# Patient Record
Sex: Male | Born: 1970 | Race: White | Hispanic: No | Marital: Married | Smoking: Never smoker
Health system: Southern US, Community
[De-identification: ages and names within clinical notes are randomized; demographics above are authoritative.]

## PROBLEM LIST (undated history)

## (undated) DIAGNOSIS — F1910 Other psychoactive substance abuse, uncomplicated: Secondary | ICD-10-CM

## (undated) DIAGNOSIS — M1990 Unspecified osteoarthritis, unspecified site: Secondary | ICD-10-CM

## (undated) DIAGNOSIS — I10 Essential (primary) hypertension: Secondary | ICD-10-CM

## (undated) DIAGNOSIS — E119 Type 2 diabetes mellitus without complications: Secondary | ICD-10-CM

---

## 2006-08-28 ENCOUNTER — Ambulatory Visit: Payer: Self-pay | Admitting: Emergency Medicine

## 2006-08-28 LAB — CONVERTED CEMR LAB
Chloride: 103 meq/L (ref 96–112)
GFR calc Af Amer: 97 mL/min
GFR calc non Af Amer: 81 mL/min
Glucose, Bld: 97 mg/dL (ref 70–99)
Potassium: 3.9 meq/L (ref 3.5–5.1)
Pro B Natriuretic peptide (BNP): 9 pg/mL (ref 0.0–100.0)
Sodium: 140 meq/L (ref 135–145)

## 2006-09-02 ENCOUNTER — Ambulatory Visit: Payer: Self-pay

## 2006-09-16 ENCOUNTER — Ambulatory Visit (HOSPITAL_BASED_OUTPATIENT_CLINIC_OR_DEPARTMENT_OTHER): Admission: RE | Admit: 2006-09-16 | Discharge: 2006-09-16 | Payer: Self-pay | Admitting: Emergency Medicine

## 2006-09-28 ENCOUNTER — Ambulatory Visit: Payer: Self-pay | Admitting: Emergency Medicine

## 2006-10-02 ENCOUNTER — Ambulatory Visit: Payer: Self-pay | Admitting: Pulmonary Disease

## 2007-04-19 DIAGNOSIS — M1990 Unspecified osteoarthritis, unspecified site: Secondary | ICD-10-CM | POA: Insufficient documentation

## 2007-04-19 DIAGNOSIS — K219 Gastro-esophageal reflux disease without esophagitis: Secondary | ICD-10-CM

## 2007-04-19 DIAGNOSIS — E669 Obesity, unspecified: Secondary | ICD-10-CM

## 2007-04-19 DIAGNOSIS — I10 Essential (primary) hypertension: Secondary | ICD-10-CM | POA: Insufficient documentation

## 2007-04-19 DIAGNOSIS — R0602 Shortness of breath: Secondary | ICD-10-CM

## 2007-04-19 DIAGNOSIS — G609 Hereditary and idiopathic neuropathy, unspecified: Secondary | ICD-10-CM | POA: Insufficient documentation

## 2007-04-19 DIAGNOSIS — Z87891 Personal history of nicotine dependence: Secondary | ICD-10-CM

## 2013-01-14 ENCOUNTER — Institutional Professional Consult (permissible substitution): Payer: Self-pay | Admitting: Internal Medicine

## 2013-01-20 ENCOUNTER — Institutional Professional Consult (permissible substitution): Payer: Self-pay | Admitting: Internal Medicine

## 2013-01-24 ENCOUNTER — Institutional Professional Consult (permissible substitution): Payer: Self-pay | Admitting: Internal Medicine

## 2017-11-10 ENCOUNTER — Encounter (HOSPITAL_BASED_OUTPATIENT_CLINIC_OR_DEPARTMENT_OTHER): Payer: Self-pay

## 2019-08-10 ENCOUNTER — Encounter (HOSPITAL_COMMUNITY): Payer: Self-pay

## 2019-08-10 ENCOUNTER — Inpatient Hospital Stay (HOSPITAL_COMMUNITY)
Admission: EM | Admit: 2019-08-10 | Discharge: 2019-08-14 | DRG: 616 | Disposition: A | Discharge status: Home Health | Payer: Medicare Other | Attending: Internal Medicine | Admitting: Internal Medicine

## 2019-08-10 ENCOUNTER — Emergency Department (HOSPITAL_COMMUNITY): Payer: Medicare Other

## 2019-08-10 DIAGNOSIS — D509 Iron deficiency anemia, unspecified: Secondary | ICD-10-CM | POA: Diagnosis present

## 2019-08-10 DIAGNOSIS — E43 Unspecified severe protein-calorie malnutrition: Secondary | ICD-10-CM | POA: Diagnosis present

## 2019-08-10 DIAGNOSIS — E871 Hypo-osmolality and hyponatremia: Secondary | ICD-10-CM | POA: Diagnosis present

## 2019-08-10 DIAGNOSIS — L97509 Non-pressure chronic ulcer of other part of unspecified foot with unspecified severity: Secondary | ICD-10-CM | POA: Diagnosis not present

## 2019-08-10 DIAGNOSIS — Z885 Allergy status to narcotic agent status: Secondary | ICD-10-CM

## 2019-08-10 DIAGNOSIS — M1990 Unspecified osteoarthritis, unspecified site: Secondary | ICD-10-CM | POA: Diagnosis present

## 2019-08-10 DIAGNOSIS — E1152 Type 2 diabetes mellitus with diabetic peripheral angiopathy with gangrene: Secondary | ICD-10-CM | POA: Diagnosis present

## 2019-08-10 DIAGNOSIS — L97419 Non-pressure chronic ulcer of right heel and midfoot with unspecified severity: Secondary | ICD-10-CM | POA: Diagnosis present

## 2019-08-10 DIAGNOSIS — M869 Osteomyelitis, unspecified: Secondary | ICD-10-CM

## 2019-08-10 DIAGNOSIS — Z794 Long term (current) use of insulin: Secondary | ICD-10-CM | POA: Diagnosis not present

## 2019-08-10 DIAGNOSIS — E876 Hypokalemia: Secondary | ICD-10-CM | POA: Diagnosis present

## 2019-08-10 DIAGNOSIS — E11621 Type 2 diabetes mellitus with foot ulcer: Secondary | ICD-10-CM | POA: Diagnosis present

## 2019-08-10 DIAGNOSIS — E860 Dehydration: Secondary | ICD-10-CM | POA: Diagnosis present

## 2019-08-10 DIAGNOSIS — Z20822 Contact with and (suspected) exposure to covid-19: Secondary | ICD-10-CM | POA: Diagnosis present

## 2019-08-10 DIAGNOSIS — E1169 Type 2 diabetes mellitus with other specified complication: Principal | ICD-10-CM | POA: Diagnosis present

## 2019-08-10 DIAGNOSIS — Z6841 Body Mass Index (BMI) 40.0 and over, adult: Secondary | ICD-10-CM | POA: Diagnosis not present

## 2019-08-10 DIAGNOSIS — L089 Local infection of the skin and subcutaneous tissue, unspecified: Secondary | ICD-10-CM | POA: Diagnosis present

## 2019-08-10 DIAGNOSIS — I872 Venous insufficiency (chronic) (peripheral): Secondary | ICD-10-CM | POA: Diagnosis present

## 2019-08-10 DIAGNOSIS — K219 Gastro-esophageal reflux disease without esophagitis: Secondary | ICD-10-CM | POA: Diagnosis present

## 2019-08-10 DIAGNOSIS — G894 Chronic pain syndrome: Secondary | ICD-10-CM | POA: Diagnosis present

## 2019-08-10 DIAGNOSIS — L97929 Non-pressure chronic ulcer of unspecified part of left lower leg with unspecified severity: Secondary | ICD-10-CM | POA: Diagnosis not present

## 2019-08-10 DIAGNOSIS — I87333 Chronic venous hypertension (idiopathic) with ulcer and inflammation of bilateral lower extremity: Secondary | ICD-10-CM | POA: Diagnosis present

## 2019-08-10 DIAGNOSIS — F1123 Opioid dependence with withdrawal: Secondary | ICD-10-CM | POA: Diagnosis present

## 2019-08-10 DIAGNOSIS — I10 Essential (primary) hypertension: Secondary | ICD-10-CM | POA: Diagnosis present

## 2019-08-10 DIAGNOSIS — M86271 Subacute osteomyelitis, right ankle and foot: Secondary | ICD-10-CM | POA: Diagnosis present

## 2019-08-10 DIAGNOSIS — D72829 Elevated white blood cell count, unspecified: Secondary | ICD-10-CM | POA: Diagnosis present

## 2019-08-10 DIAGNOSIS — L97519 Non-pressure chronic ulcer of other part of right foot with unspecified severity: Secondary | ICD-10-CM | POA: Diagnosis present

## 2019-08-10 DIAGNOSIS — L02611 Cutaneous abscess of right foot: Secondary | ICD-10-CM | POA: Diagnosis present

## 2019-08-10 DIAGNOSIS — L97919 Non-pressure chronic ulcer of unspecified part of right lower leg with unspecified severity: Secondary | ICD-10-CM | POA: Diagnosis not present

## 2019-08-10 DIAGNOSIS — E1142 Type 2 diabetes mellitus with diabetic polyneuropathy: Secondary | ICD-10-CM | POA: Diagnosis present

## 2019-08-10 DIAGNOSIS — R7989 Other specified abnormal findings of blood chemistry: Secondary | ICD-10-CM | POA: Diagnosis present

## 2019-08-10 DIAGNOSIS — E1165 Type 2 diabetes mellitus with hyperglycemia: Secondary | ICD-10-CM | POA: Diagnosis present

## 2019-08-10 DIAGNOSIS — F1110 Opioid abuse, uncomplicated: Secondary | ICD-10-CM | POA: Diagnosis present

## 2019-08-10 HISTORY — DX: Essential (primary) hypertension: I10

## 2019-08-10 HISTORY — DX: Other psychoactive substance abuse, uncomplicated: F19.10

## 2019-08-10 HISTORY — DX: Type 2 diabetes mellitus without complications: E11.9

## 2019-08-10 HISTORY — DX: Unspecified osteoarthritis, unspecified site: M19.90

## 2019-08-10 LAB — COMPREHENSIVE METABOLIC PANEL
ALT: 12 U/L (ref 0–44)
AST: 13 U/L — ABNORMAL LOW (ref 15–41)
Albumin: 2 g/dL — ABNORMAL LOW (ref 3.5–5.0)
Alkaline Phosphatase: 194 U/L — ABNORMAL HIGH (ref 38–126)
Anion gap: 9 (ref 5–15)
BUN: 11 mg/dL (ref 6–20)
CO2: 29 mmol/L (ref 22–32)
Calcium: 8.5 mg/dL — ABNORMAL LOW (ref 8.9–10.3)
Chloride: 90 mmol/L — ABNORMAL LOW (ref 98–111)
Creatinine, Ser: 0.67 mg/dL (ref 0.61–1.24)
GFR calc Af Amer: >60 mL/min (ref >60)
GFR calc non Af Amer: >60 mL/min (ref >60)
Glucose, Bld: 279 mg/dL — ABNORMAL HIGH (ref 70–99)
Potassium: 4.1 mmol/L (ref 3.5–5.1)
Sodium: 128 mmol/L — ABNORMAL LOW (ref 135–145)
Total Bilirubin: 0.3 mg/dL (ref 0.3–1.2)
Total Protein: 8.4 g/dL — ABNORMAL HIGH (ref 6.5–8.1)

## 2019-08-10 LAB — CBC WITH DIFFERENTIAL/PLATELET
Abs Immature Granulocytes: 0.11 10*3/uL — ABNORMAL HIGH (ref 0.00–0.07)
Basophils Absolute: 0.1 10*3/uL (ref 0.0–0.1)
Basophils Relative: 0 %
Eosinophils Absolute: 0.2 10*3/uL (ref 0.0–0.5)
Eosinophils Relative: 1 %
HCT: 29 % — ABNORMAL LOW (ref 39.0–52.0)
Hemoglobin: 8.7 g/dL — ABNORMAL LOW (ref 13.0–17.0)
Immature Granulocytes: 1 %
Lymphocytes Relative: 14 %
Lymphs Abs: 2.4 10*3/uL (ref 0.7–4.0)
MCH: 22 pg — ABNORMAL LOW (ref 26.0–34.0)
MCHC: 30 g/dL (ref 30.0–36.0)
MCV: 73.2 fL — ABNORMAL LOW (ref 80.0–100.0)
Monocytes Absolute: 1.8 10*3/uL — ABNORMAL HIGH (ref 0.1–1.0)
Monocytes Relative: 11 %
Neutro Abs: 12.4 10*3/uL — ABNORMAL HIGH (ref 1.7–7.7)
Neutrophils Relative %: 73 %
Platelets: 611 10*3/uL — ABNORMAL HIGH (ref 150–400)
RBC: 3.96 MIL/uL — ABNORMAL LOW (ref 4.22–5.81)
RDW: 15.9 % — ABNORMAL HIGH (ref 11.5–15.5)
WBC: 16.9 10*3/uL — ABNORMAL HIGH (ref 4.0–10.5)
nRBC: 0 % (ref 0.0–0.2)

## 2019-08-10 LAB — LACTIC ACID, PLASMA: Lactic Acid, Venous: 1.6 mmol/L (ref 0.5–1.9)

## 2019-08-10 LAB — GLUCOSE, CAPILLARY: Glucose-Capillary: 216 mg/dL — ABNORMAL HIGH (ref 70–99)

## 2019-08-10 MED ORDER — ACETAMINOPHEN 325 MG PO TABS: 650.0000 mg | ORAL_TABLET | Freq: Four times a day (QID) | ORAL | Status: DC | PRN

## 2019-08-10 MED ORDER — SODIUM CHLORIDE 0.9 % IV SOLN
INTRAVENOUS | Status: DC
  Administered 2019-08-10: 19:00:00 1000 mL via INTRAVENOUS

## 2019-08-10 MED ORDER — INSULIN ASPART 100 UNIT/ML ~~LOC~~ SOLN
0.0000 [IU] | Freq: Every day | SUBCUTANEOUS | Status: DC
  Administered 2019-08-11: 2 [IU] via SUBCUTANEOUS
  Filled 2019-08-10: qty 0.05

## 2019-08-10 MED ORDER — SODIUM CHLORIDE 0.9 % IV SOLN
2.0000 g | Freq: Once | INTRAVENOUS | Status: AC
  Administered 2019-08-11: 01:00:00 2 g via INTRAVENOUS
  Filled 2019-08-10: qty 2

## 2019-08-10 MED ORDER — ONDANSETRON HCL 4 MG PO TABS: 4.0000 mg | ORAL_TABLET | Freq: Four times a day (QID) | ORAL | Status: DC | PRN

## 2019-08-10 MED ORDER — INSULIN ASPART 100 UNIT/ML ~~LOC~~ SOLN
0.0000 [IU] | Freq: Three times a day (TID) | SUBCUTANEOUS | Status: DC
  Administered 2019-08-11: 4 [IU] via SUBCUTANEOUS
  Administered 2019-08-13: 7 [IU] via SUBCUTANEOUS
  Administered 2019-08-14: 3 [IU] via SUBCUTANEOUS
  Filled 2019-08-10: qty 0.2

## 2019-08-10 MED ORDER — SODIUM CHLORIDE 0.9 % IV SOLN: INTRAVENOUS | Status: DC

## 2019-08-10 MED ORDER — KETOROLAC TROMETHAMINE 30 MG/ML IJ SOLN
30.0000 mg | Freq: Four times a day (QID) | INTRAMUSCULAR | Status: DC | PRN
  Administered 2019-08-11 – 2019-08-14 (×5): 30 mg via INTRAVENOUS
  Filled 2019-08-10 (×5): qty 1

## 2019-08-10 MED ORDER — VANCOMYCIN HCL IN DEXTROSE 1-5 GM/200ML-% IV SOLN
1000.0000 mg | Freq: Once | INTRAVENOUS | Status: AC
  Administered 2019-08-10: 1000 mg via INTRAVENOUS
  Filled 2019-08-10: qty 200

## 2019-08-10 MED ORDER — VANCOMYCIN HCL 1250 MG/250ML IV SOLN
1250.0000 mg | Freq: Once | INTRAVENOUS | Status: AC
  Administered 2019-08-11: 1250 mg via INTRAVENOUS
  Filled 2019-08-10 (×2): qty 250

## 2019-08-10 MED ORDER — ENOXAPARIN SODIUM 40 MG/0.4ML ~~LOC~~ SOLN
40.0000 mg | Freq: Every day | SUBCUTANEOUS | Status: DC
  Administered 2019-08-11: 40 mg via SUBCUTANEOUS
  Filled 2019-08-10: qty 0.4

## 2019-08-10 MED ORDER — ONDANSETRON HCL 4 MG/2ML IJ SOLN
4.0000 mg | Freq: Four times a day (QID) | INTRAMUSCULAR | Status: DC | PRN
  Administered 2019-08-11 (×2): 4 mg via INTRAVENOUS
  Filled 2019-08-10: qty 2

## 2019-08-10 MED ORDER — ACETAMINOPHEN 650 MG RE SUPP: 650.0000 mg | Freq: Four times a day (QID) | RECTAL | Status: DC | PRN

## 2019-08-10 NOTE — ED Provider Notes (Signed)
[facility]-EMERGENCY DEPT Provider Note   CSN: [id number] Arrival date & time: 08/10/19  1814     History Chief Complaint  Patient presents with  . Wound Infection    bottom right foot    Kurt Butler is a 49 y.o. male.  49 year old male presents with wounds to his right lower extremity.  States that he has had chronic pain for quite some time and has been self-medicating by snorting heroin.  Denies any fever or chills.  Patient notes that he has chronic paresthesias to his lower extremities.  Has noted some drainage from the heel of his right foot as well as from the right lateral aspect of his lower extremity.  No treatment used prior to arrival        Past Medical History:  Diagnosis Date  . Arthritis   . Diabetes mellitus without complication (HCC)   . Drug abuse (HCC)   . Hypertension     Patient Active Problem List   Diagnosis Date Noted  . OBESITY 04/19/2007  . PERIPHERAL NEUROPATHY 04/19/2007  . HYPERTENSION 04/19/2007  . GERD 04/19/2007  . OSTEOARTHRITIS 04/19/2007  . DYSPNEA 04/19/2007  . TOBACCO ABUSE, HX OF 04/19/2007         No family history on file.  Social History   Tobacco Use  . Smoking status: Not on file  Substance Use Topics  . Alcohol use: Not Currently  . Drug use: Yes    Comment: Sniffs heroin    Home Medications Prior to Admission medications   Not on File    Allergies    Oxycodone  Review of Systems   Review of Systems  All other systems reviewed and are negative.   Physical Exam Updated Vital Signs BP 139/66 (BP Location: Right Arm)   Pulse 99   Temp 99.8 F (37.7 C)   Resp (!) 21   Ht 1.854 m (6\' 1" )   Wt (!) 163.3 kg   SpO2 99%   BMI 47.50 kg/m   Physical Exam Vitals and nursing note reviewed.  Constitutional:      General: He is not in acute distress.    Appearance: Normal appearance. He is well-developed. He is not toxic-appearing.  HENT:     Head: Normocephalic and  atraumatic.  Eyes:     General: Lids are normal.     Conjunctiva/sclera: Conjunctivae normal.     Pupils: Pupils are equal, round, and reactive to light.  Neck:     Thyroid: No thyroid mass.     Trachea: No tracheal deviation.  Cardiovascular:     Rate and Rhythm: Normal rate and regular rhythm.     Heart sounds: Normal heart sounds. No murmur. No gallop.   Pulmonary:     Effort: Pulmonary effort is normal. No respiratory distress.     Breath sounds: Normal breath sounds. No stridor. No decreased breath sounds, wheezing, rhonchi or rales.  Abdominal:     General: Bowel sounds are normal. There is no distension.     Palpations: Abdomen is soft.     Tenderness: There is no abdominal tenderness. There is no rebound.  Musculoskeletal:        General: No tenderness. Normal range of motion.     Cervical back: Normal range of motion and neck supple.       Legs:  Skin:    General: Skin is warm and dry.     Coloration: Skin is ashen.     Findings: Lesion and  wound present.  Neurological:     Mental Status: He is alert and oriented to person, place, and time.     GCS: GCS eye subscore is 4. GCS verbal subscore is 5. GCS motor subscore is 6.     Cranial Nerves: No cranial nerve deficit.     Sensory: No sensory deficit.  Psychiatric:        Speech: Speech normal.        Behavior: Behavior normal.     ED Results / Procedures / Treatments   Labs (all labs ordered are listed, but only abnormal results are displayed) Labs Reviewed  CULTURE, BLOOD (ROUTINE X 2)  CULTURE, BLOOD (ROUTINE X 2)  CBC WITH DIFFERENTIAL/PLATELET  COMPREHENSIVE METABOLIC PANEL  LACTIC ACID, PLASMA    EKG None  Radiology No results found.  Procedures Procedures (including critical care time)  Medications Ordered in ED Medications  0.9 %  sodium chloride infusion (has no administration in time range)    ED Course  I have reviewed the triage vital signs and the nursing notes.  Pertinent labs &  imaging results that were available during my care of the patient were reviewed by me and considered in my medical decision making (see chart for details).    MDM Rules/Calculators/A&P                     Patient is x-rays consistent with osteomyelitis and patient started on vancomycin.  Does have leukocytosis noted.  Lactate within normal limits.  Will admit to the hospitalist service Final Clinical Impression(s) / ED Diagnoses Final diagnoses:  None    Rx / DC Orders ED Discharge Orders    None       Lacretia Leigh, MD 08/10/19 2015

## 2019-08-10 NOTE — ED Triage Notes (Signed)
Patient arrived by [facility] with c/o wound infection on bottom of right foot. EMS reports moderate amount of foul smelling drainage from wound. Patient endorses using heroin for pain control since he has not been going to pain clinic. Patient also concerned about withdrawing from heroin.

## 2019-08-11 ENCOUNTER — Other Ambulatory Visit: Payer: Self-pay | Admitting: Physician Assistant

## 2019-08-11 ENCOUNTER — Other Ambulatory Visit: Payer: Self-pay

## 2019-08-11 ENCOUNTER — Encounter (HOSPITAL_COMMUNITY): Payer: Self-pay | Admitting: Internal Medicine

## 2019-08-11 DIAGNOSIS — E11621 Type 2 diabetes mellitus with foot ulcer: Secondary | ICD-10-CM

## 2019-08-11 DIAGNOSIS — L97929 Non-pressure chronic ulcer of unspecified part of left lower leg with unspecified severity: Secondary | ICD-10-CM

## 2019-08-11 DIAGNOSIS — L089 Local infection of the skin and subcutaneous tissue, unspecified: Secondary | ICD-10-CM

## 2019-08-11 DIAGNOSIS — M86271 Subacute osteomyelitis, right ankle and foot: Secondary | ICD-10-CM

## 2019-08-11 DIAGNOSIS — Z6841 Body Mass Index (BMI) 40.0 and over, adult: Secondary | ICD-10-CM

## 2019-08-11 DIAGNOSIS — I87333 Chronic venous hypertension (idiopathic) with ulcer and inflammation of bilateral lower extremity: Secondary | ICD-10-CM

## 2019-08-11 DIAGNOSIS — Z794 Long term (current) use of insulin: Secondary | ICD-10-CM

## 2019-08-11 DIAGNOSIS — F1110 Opioid abuse, uncomplicated: Secondary | ICD-10-CM | POA: Diagnosis present

## 2019-08-11 DIAGNOSIS — E43 Unspecified severe protein-calorie malnutrition: Secondary | ICD-10-CM

## 2019-08-11 DIAGNOSIS — L97919 Non-pressure chronic ulcer of unspecified part of right lower leg with unspecified severity: Secondary | ICD-10-CM

## 2019-08-11 DIAGNOSIS — L97509 Non-pressure chronic ulcer of other part of unspecified foot with unspecified severity: Secondary | ICD-10-CM

## 2019-08-11 DIAGNOSIS — E6609 Other obesity due to excess calories: Secondary | ICD-10-CM

## 2019-08-11 LAB — COMPREHENSIVE METABOLIC PANEL
ALT: 12 U/L (ref 0–44)
AST: 24 U/L (ref 15–41)
Albumin: 1.9 g/dL — ABNORMAL LOW (ref 3.5–5.0)
Alkaline Phosphatase: 170 U/L — ABNORMAL HIGH (ref 38–126)
Anion gap: 10 (ref 5–15)
BUN: 10 mg/dL (ref 6–20)
CO2: 29 mmol/L (ref 22–32)
Calcium: 8.4 mg/dL — ABNORMAL LOW (ref 8.9–10.3)
Chloride: 95 mmol/L — ABNORMAL LOW (ref 98–111)
Creatinine, Ser: 0.7 mg/dL (ref 0.61–1.24)
GFR calc Af Amer: >60 mL/min (ref >60)
GFR calc non Af Amer: >60 mL/min (ref >60)
Glucose, Bld: 175 mg/dL — ABNORMAL HIGH (ref 70–99)
Potassium: 4.4 mmol/L (ref 3.5–5.1)
Sodium: 134 mmol/L — ABNORMAL LOW (ref 135–145)
Total Bilirubin: 1.1 mg/dL (ref 0.3–1.2)
Total Protein: 7.8 g/dL (ref 6.5–8.1)

## 2019-08-11 LAB — GLUCOSE, CAPILLARY
Glucose-Capillary: 178 mg/dL — ABNORMAL HIGH (ref 70–99)
Glucose-Capillary: 175 mg/dL — ABNORMAL HIGH (ref 70–99)
Glucose-Capillary: 178 mg/dL — ABNORMAL HIGH (ref 70–99)
Glucose-Capillary: 155 mg/dL — ABNORMAL HIGH (ref 70–99)

## 2019-08-11 LAB — CBC
HCT: 28.2 % — ABNORMAL LOW (ref 39.0–52.0)
Hemoglobin: 8.5 g/dL — ABNORMAL LOW (ref 13.0–17.0)
MCH: 21.9 pg — ABNORMAL LOW (ref 26.0–34.0)
MCHC: 30.1 g/dL (ref 30.0–36.0)
MCV: 72.5 fL — ABNORMAL LOW (ref 80.0–100.0)
Platelets: 582 10*3/uL — ABNORMAL HIGH (ref 150–400)
RBC: 3.89 MIL/uL — ABNORMAL LOW (ref 4.22–5.81)
RDW: 16 % — ABNORMAL HIGH (ref 11.5–15.5)
WBC: 16.2 10*3/uL — ABNORMAL HIGH (ref 4.0–10.5)
nRBC: 0 % (ref 0.0–0.2)

## 2019-08-11 LAB — HIV ANTIBODY (ROUTINE TESTING W REFLEX): HIV Screen 4th Generation wRfx: NONREACTIVE

## 2019-08-11 LAB — HEMOGLOBIN A1C
Hgb A1c MFr Bld: 9.8 % — ABNORMAL HIGH (ref 4.8–5.6)
Mean Plasma Glucose: 234.56 mg/dL

## 2019-08-11 LAB — SARS CORONAVIRUS 2 (TAT 6-24 HRS): SARS Coronavirus 2: NEGATIVE

## 2019-08-11 MED ORDER — LOPERAMIDE HCL 2 MG PO CAPS
2.0000 mg | ORAL_CAPSULE | ORAL | Status: DC | PRN
  Administered 2019-08-11: 11:00:00 2 mg via ORAL
  Filled 2019-08-11: qty 1

## 2019-08-11 MED ORDER — ONDANSETRON HCL 4 MG PO TABS
4.0000 mg | ORAL_TABLET | ORAL | Status: DC | PRN
  Administered 2019-08-14 (×2): 4 mg via ORAL
  Filled 2019-08-11 (×2): qty 1

## 2019-08-11 MED ORDER — PROMETHAZINE HCL 25 MG/ML IJ SOLN
25.0000 mg | Freq: Four times a day (QID) | INTRAMUSCULAR | Status: DC | PRN
  Administered 2019-08-11 – 2019-08-13 (×4): 25 mg via INTRAVENOUS
  Filled 2019-08-11 (×4): qty 1

## 2019-08-11 MED ORDER — METRONIDAZOLE IN NACL 5-0.79 MG/ML-% IV SOLN
500.0000 mg | Freq: Three times a day (TID) | INTRAVENOUS | Status: DC
  Administered 2019-08-11 – 2019-08-14 (×9): 500 mg via INTRAVENOUS
  Filled 2019-08-11 (×10): qty 100

## 2019-08-11 MED ORDER — PROMETHAZINE HCL 25 MG/ML IJ SOLN
12.5000 mg | Freq: Once | INTRAMUSCULAR | Status: AC
  Administered 2019-08-11: 12.5 mg via INTRAVENOUS
  Filled 2019-08-11: qty 1

## 2019-08-11 MED ORDER — ONDANSETRON HCL 4 MG/2ML IJ SOLN
4.0000 mg | INTRAMUSCULAR | Status: DC | PRN
  Administered 2019-08-11 – 2019-08-13 (×4): 4 mg via INTRAVENOUS
  Filled 2019-08-11 (×4): qty 2

## 2019-08-11 MED ORDER — ENOXAPARIN SODIUM 80 MG/0.8ML ~~LOC~~ SOLN: 0.5000 mg/kg | SUBCUTANEOUS | Status: DC

## 2019-08-11 MED ORDER — OXYCODONE HCL 5 MG PO TABS
5.0000 mg | ORAL_TABLET | ORAL | Status: AC | PRN
  Administered 2019-08-11 – 2019-08-13 (×4): 5 mg via ORAL
  Filled 2019-08-11 (×5): qty 1

## 2019-08-11 MED ORDER — IBUPROFEN 200 MG PO TABS: 600.0000 mg | ORAL_TABLET | Freq: Four times a day (QID) | ORAL | Status: DC | PRN

## 2019-08-11 MED ORDER — HYDROXYZINE HCL 25 MG PO TABS
25.0000 mg | ORAL_TABLET | Freq: Three times a day (TID) | ORAL | Status: DC | PRN
  Administered 2019-08-13 – 2019-08-14 (×2): 25 mg via ORAL
  Filled 2019-08-11 (×2): qty 1

## 2019-08-11 MED ORDER — BACLOFEN 5 MG HALF TABLET: 5.0000 mg | ORAL_TABLET | Freq: Three times a day (TID) | ORAL | Status: DC | PRN

## 2019-08-11 MED ORDER — MORPHINE SULFATE (PF) 4 MG/ML IV SOLN
4.0000 mg | INTRAVENOUS | Status: DC | PRN
  Administered 2019-08-11 – 2019-08-12 (×4): 4 mg via INTRAVENOUS
  Filled 2019-08-11 (×4): qty 1

## 2019-08-11 MED ORDER — PANTOPRAZOLE SODIUM 40 MG PO TBEC
40.0000 mg | DELAYED_RELEASE_TABLET | Freq: Every day | ORAL | Status: DC
  Administered 2019-08-11 – 2019-08-14 (×3): 40 mg via ORAL
  Filled 2019-08-11 (×3): qty 1

## 2019-08-11 MED ORDER — DICYCLOMINE HCL 20 MG PO TABS
20.0000 mg | ORAL_TABLET | Freq: Three times a day (TID) | ORAL | Status: DC
  Administered 2019-08-11 – 2019-08-14 (×11): 20 mg via ORAL
  Filled 2019-08-11 (×16): qty 1

## 2019-08-11 MED ORDER — SODIUM CHLORIDE 0.9 % IV SOLN
2.0000 g | INTRAVENOUS | Status: DC
  Administered 2019-08-11 – 2019-08-13 (×3): 2 g via INTRAVENOUS
  Filled 2019-08-11 (×4): qty 20

## 2019-08-11 MED ORDER — SODIUM CHLORIDE 0.9 % IV SOLN
2.0000 g | Freq: Three times a day (TID) | INTRAVENOUS | Status: DC
  Filled 2019-08-11 (×2): qty 2

## 2019-08-11 MED ORDER — CLONIDINE HCL 0.1 MG PO TABS
0.1000 mg | ORAL_TABLET | Freq: Once | ORAL | Status: AC
  Administered 2019-08-11: 0.1 mg via ORAL
  Filled 2019-08-11: qty 1

## 2019-08-11 MED ORDER — MORPHINE SULFATE (PF) 4 MG/ML IV SOLN
4.0000 mg | Freq: Once | INTRAVENOUS | Status: AC
  Administered 2019-08-11: 05:00:00 4 mg via INTRAVENOUS
  Filled 2019-08-11: qty 1

## 2019-08-11 MED ORDER — VANCOMYCIN HCL 1500 MG/300ML IV SOLN
1500.0000 mg | Freq: Three times a day (TID) | INTRAVENOUS | Status: DC
  Administered 2019-08-11 – 2019-08-13 (×6): 1500 mg via INTRAVENOUS
  Filled 2019-08-11 (×8): qty 300

## 2019-08-11 NOTE — Progress Notes (Signed)
Pharmacy Antibiotic Note  Kurt Butler is a 49 y.o. male admitted on 08/10/2019 with Osteomyelitis.  Pharmacy has been consulted for Vancomycin, cefepime dosing.  Plan: Vancomycin 1gm iv x1, then Vancomycin 1500 mg IV Q 8 hrs. Goal AUC 400-550. Expected AUC: 505 SCr used: 0.8 (adjusted) Vd: 0.5 L/kg  Cefepime 2gm iv x1, then 2gm iv q8hr   Height: 6\' 1"  (185.4 cm) Weight: (!) 360 lb (163.3 kg) IBW/kg (Calculated) : 79.9  Temp (24hrs), Avg:99.2 F (37.3 C), Min:98.6 F (37 C), Max:99.8 F (37.7 C)  Recent Labs  Lab 08/10/19 1920 08/11/19 0523  WBC 16.9* 16.2*  CREATININE 0.67 0.70  LATICACIDVEN 1.6  --     Estimated Creatinine Clearance: 179 mL/min (by C-G formula based on SCr of 0.7 mg/dL).    Allergies  Allergen Reactions  . Oxycodone Palpitations    Antimicrobials this admission: Vancomycin 08/11/2019 >> Cefepime 08/11/2019 >>   Dose adjustments this admission: -  Microbiology results: -  Thank you for allowing pharmacy to be a part of this patient's care.  08/13/2019 [place] 08/11/2019 6:13 AM

## 2019-08-11 NOTE — Consult Note (Signed)
ORTHOPAEDIC CONSULTATION  REQUESTING PHYSICIAN: Pokhrel, Laxman, MD  Chief Complaint: Nausea and vomiting with necrotic ulcer right foot and heroin withdrawal  HPI: Kurt Butler is a 49 y.o. male who presents with osteomyelitis abscess and ulceration of the right hindfoot.  Patient states he has been in extreme pain his doctor has cut off his pain medicine and patient has been snorting heroin.  Past medical history significant for type 2 diabetes arthritis drug abuse and hypertension.  Social history patient states he lives at home with spouse and child.    Social History   Socioeconomic History  . Marital status: Married    Spouse name: Not on file  . Number of children: Not on file  . Years of education: Not on file  . Highest education level: Not on file  Occupational History  . Not on file  Tobacco Use  . Smoking status: Never Smoker  . Smokeless tobacco: Never Used  Substance and Sexual Activity  . Alcohol use: Not Currently  . Drug use: Yes    Comment: Sniffs heroin  . Sexual activity: Not Currently  Other Topics Concern  . Not on file  Social History Narrative  . Not on file   Social Determinants of Health   Financial Resource Strain:   . Difficulty of Paying Living Expenses: Not on file  Food Insecurity:   . Worried About Charity fundraiser in the Last Year: Not on file  . Ran Out of Food in the Last Year: Not on file  Transportation Needs:   . Lack of Transportation (Medical): Not on file  . Lack of Transportation (Non-Medical): Not on file  Physical Activity:   . Days of Exercise per Week: Not on file  . Minutes of Exercise per Session: Not on file  Stress:   . Feeling of Stress : Not on file  Social Connections:   . Frequency of Communication with Friends and Family: Not on file  . Frequency of Social Gatherings with Friends and Family: Not on file  . Attends Religious Services: Not on file  . Active Member of Clubs or Organizations: Not on  file  . Attends Archivist Meetings: Not on file  . Marital Status: Not on file   No family history on file. - negative except otherwise stated in the family history section Allergies  Allergen Reactions  . Oxycodone Palpitations   Prior to Admission medications   Not on File   DG Ankle 2 Views Right  Result Date: 08/10/2019 CLINICAL DATA:  Wound infection on bottom of foot EXAM: RIGHT FOOT - 2 VIEW; RIGHT ANKLE - 2 VIEW COMPARISON:  Report 01/18/2013 FINDINGS: Right foot: Diffuse soft tissue edema. No fracture or malalignment within the digits of the right foot. Loss of the heel pad soft tissues presumably due to large ulcer. Lucency and probable mild fragmentation at the plantar surface of the calcaneus. Distorted appearance of the talocalcaneal interval. Potential widened joint space between the navicular and first cuneiform dorsally. Right ankle: Mild periosteal reaction involving the tibial and fibular shaft distally. Diffuse soft tissue edema. Ankle mortise grossly symmetric. Abnormal appearance of calcaneus. Distorted anatomy at the talocalcaneal interval. IMPRESSION: 1. Prominent loss of heel pad soft tissues with lucency, presumably due to large ulceration. There is lucency and cortical fragmentation of the plantar aspect of the calcaneus bone suspicious for osteomyelitis. 2. Poorly defined subtalar joints with distorted appearance of hindfoot anatomy. MRI would better demonstrate extent of suspected osteomyelitic changes.  Electronically Signed   By: Jasmine Pang M.D.   On: 08/10/2019 19:29   DG Foot 2 Views Right  Result Date: 08/10/2019 CLINICAL DATA:  Wound infection on bottom of foot EXAM: RIGHT FOOT - 2 VIEW; RIGHT ANKLE - 2 VIEW COMPARISON:  Report 01/18/2013 FINDINGS: Right foot: Diffuse soft tissue edema. No fracture or malalignment within the digits of the right foot. Loss of the heel pad soft tissues presumably due to large ulcer. Lucency and probable mild  fragmentation at the plantar surface of the calcaneus. Distorted appearance of the talocalcaneal interval. Potential widened joint space between the navicular and first cuneiform dorsally. Right ankle: Mild periosteal reaction involving the tibial and fibular shaft distally. Diffuse soft tissue edema. Ankle mortise grossly symmetric. Abnormal appearance of calcaneus. Distorted anatomy at the talocalcaneal interval. IMPRESSION: 1. Prominent loss of heel pad soft tissues with lucency, presumably due to large ulceration. There is lucency and cortical fragmentation of the plantar aspect of the calcaneus bone suspicious for osteomyelitis. 2. Poorly defined subtalar joints with distorted appearance of hindfoot anatomy. MRI would better demonstrate extent of suspected osteomyelitic changes. Electronically Signed   By: Jasmine Pang M.D.   On: 08/10/2019 19:29   - pertinent xrays, CT, MRI studies were reviewed and independently interpreted  Positive ROS: All other systems have been reviewed and were otherwise negative with the exception of those mentioned in the HPI and as above.  Physical Exam: General: Alert, no acute distress Psychiatric: Patient is competent for consent with normal mood and affect Lymphatic: No axillary or cervical lymphadenopathy Cardiovascular: No pedal edema Respiratory: No cyanosis, no use of accessory musculature GI: No organomegaly, abdomen is soft and non-tender    Images:  @ENCIMAGES @  Labs:  Lab Results  Component Value Date   HGBA1C 9.8 (H) 08/11/2019    Lab Results  Component Value Date   ALBUMIN 1.9 (L) 08/11/2019   ALBUMIN 2.0 (L) 08/10/2019    Neurologic: Patient does not have protective sensation bilateral lower extremities.   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  Examination the right lower extremity patient has large necrotic ulcerations on the leg distal and the mid third of the leg.  There is a large necrotic purulent drainage ulcer of the right heel with exposed necrotic heel bone.  Review of the radiographs shows chronic destructive changes of the calcaneus on the right.  Patient does not have palpable pulses bilaterally this may be due to his swelling.  Patient is nauseated and vomiting.  Patient has uncontrolled type 2 diabetes with a hemoglobin A1c of 9.8 and severe protein caloric malnutrition with an albumin of 1.9.  Assessment: Assessment: Uncontrolled type 2 diabetes with severe protein caloric malnutrition with gangrene abscess and osteomyelitis of the right hindfoot and right leg with ulceration on the left hindfoot and left leg as well.  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  History of snorting heroin.  Plan: Plan: Discussed with the patient that we will need to proceed with a right transtibial amputation tomorrow.  Ideally we can unload the left foot and get this to heal.  Discussed risks of surgery including nonhealing of the wound due to the massive lymphatic and  venous insufficiency risk of aspiration with surgery risk of death need for additional surgery.  Patient states he understands and wishes to proceed at this time.  Thank you for the consult and the opportunity to see Mr. Gerarda Fraction, MD [facility] [phone] 11:17 AM

## 2019-08-11 NOTE — H&P (Signed)
History and Physical    Kurt Butler DIY:[id number] DOB: [birth year] DOA: 08/10/2019  PCP: Patient, No Pcp Per (Confirm with patient/family/NH records and if not entered, this has to be entered at [facility] point of entry) Patient coming from: Home  I have personally briefly reviewed patient's old medical records in [facility]  Chief Complaint: Wound of right lower extremity  HPI: Kurt Butler is a 49 y.o. male with medical history significant of diabetes mellitus hypertension, drug abuse and arthritis presented to ED for evaluation of wound in his right lower extremity.  Patient states that he is having chronic pain and infection that is going on for some time and he is self medicating him with heroin.  Patient states that the drainage from heel of his right foot is getting worse and it is very painful and also the wound on right lateral aspect of lower extremity is also getting worse.  Patient also admits of having occasional fever with chills and nausea with vomiting but denies headache, dizziness, chest pain, shortness of breath, abdominal pain and urinary symptoms.  ED Course: Noted to ED patient had temperature of 99.8, blood pressure 136/66, heart rate between 99-103, respiratory rate 21 and oxygen saturation 99% on room air.  CBC showed leukocytosis with WBC count of 16.9 and blood glucose of 279.  X-ray of right lower extremity was consistent with osteomyelitis and patient was started on vancomycin and promethazine.  Review of Systems: As per HPI otherwise 10 point review of systems negative.    Past Medical History:  Diagnosis Date  . Arthritis   . Diabetes mellitus without complication (HCC)   . Drug abuse (HCC)   . Hypertension      The histories are not reviewed yet. Please review them in the "History" navigator section and refresh this SmartLink.   reports that he has never smoked. He has never used smokeless tobacco. He reports previous alcohol use. He reports current  drug use.  Allergies  Allergen Reactions  . Oxycodone Palpitations    No family history on file. Family history is reviewed with the patient but not pertinent.  Prior to Admission medications   Not on File    Physical Exam: Vitals:   08/10/19 2230 08/10/19 2300 08/10/19 2353 08/11/19 0612  BP: (!) 149/75 (!) 141/69 (!) 147/81 (!) 169/95  Pulse: (!) 107 89 99 96  Resp: 19  15 18   Temp:   98.6 F (37 C) 98.2 F (36.8 C)  TempSrc:   Oral Axillary  SpO2: 96% 94% 99% 100%  Weight:      Height:        Constitutional: NAD, calm, comfortable Vitals:   08/10/19 2230 08/10/19 2300 08/10/19 2353 08/11/19 0612  BP: (!) 149/75 (!) 141/69 (!) 147/81 (!) 169/95  Pulse: (!) 107 89 99 96  Resp: 19  15 18   Temp:   98.6 F (37 C) 98.2 F (36.8 C)  TempSrc:   Oral Axillary  SpO2: 96% 94% 99% 100%  Weight:      Height:       Eyes: PERRL, lids and conjunctivae normal ENMT: Mucous membranes are moist. Posterior pharynx clear of any exudate or lesions.Normal dentition.  Neck: normal, supple, no masses, no thyromegaly Respiratory: clear to auscultation bilaterally, no wheezing, no crackles. Normal respiratory effort. No accessory muscle use.  Cardiovascular: Regular rate and rhythm, no murmurs / rubs / gallops. No extremity edema. 2+ pedal pulses. No carotid bruits.  Abdomen: no tenderness, no masses palpated.  No hepatosplenomegaly. Bowel sounds positive.  Musculoskeletal: no clubbing / cyanosis. No joint deformity upper and lower extremities. Good ROM, no contractures. Normal muscle tone.  Skin: Large necrotic area noted with foul-smelling discharge on lateral right lower extremity in the shin area while large necrotic area at heel with surrounding erythema.  Left heel has healed wound. Neurologic: CN 2-12 grossly intact. Sensation intact, DTR normal. Strength 5/5 in all 4.  Psychiatric: Normal judgment and insight. Alert and oriented x 3. Normal mood.     Labs on Admission: I have  personally reviewed following labs and imaging studies  CBC: Recent Labs  Lab 08/10/19 1920 08/11/19 0523  WBC 16.9* 16.2*  NEUTROABS 12.4*  --   HGB 8.7* 8.5*  HCT 29.0* 28.2*  MCV 73.2* 72.5*  PLT 611* 433*   Basic Metabolic Panel: Recent Labs  Lab 08/10/19 1920 08/11/19 0523  NA 128* 134*  K 4.1 4.4  CL 90* 95*  CO2 29 29  GLUCOSE 279* 175*  BUN 11 10  CREATININE 0.67 0.70  CALCIUM 8.5* 8.4*   GFR: Estimated Creatinine Clearance: 179 mL/min (by C-G formula based on SCr of 0.7 mg/dL). Liver Function Tests: Recent Labs  Lab 08/10/19 1920 08/11/19 0523  AST 13* 24  ALT 12 12  ALKPHOS 194* 170*  BILITOT 0.3 1.1  PROT 8.4* 7.8  ALBUMIN 2.0* 1.9*   No results for input(s): LIPASE, AMYLASE in the last 168 hours. No results for input(s): AMMONIA in the last 168 hours. Coagulation Profile: No results for input(s): INR, PROTIME in the last 168 hours. Cardiac Enzymes: No results for input(s): CKTOTAL, CKMB, CKMBINDEX, TROPONINI in the last 168 hours. BNP (last 3 results) No results for input(s): PROBNP in the last 8760 hours. HbA1C: No results for input(s): HGBA1C in the last 72 hours. CBG: Recent Labs  Lab 08/10/19 2358  GLUCAP 216*   Lipid Profile: No results for input(s): CHOL, HDL, LDLCALC, TRIG, CHOLHDL, LDLDIRECT in the last 72 hours. Thyroid Function Tests: No results for input(s): TSH, T4TOTAL, FREET4, T3FREE, THYROIDAB in the last 72 hours. Anemia Panel: No results for input(s): VITAMINB12, FOLATE, FERRITIN, TIBC, IRON, RETICCTPCT in the last 72 hours. Urine analysis: No results found for: COLORURINE, APPEARANCEUR, [place], [place], [place], [place], BILIRUBINUR, KETONESUR, PROTEINUR, UROBILINOGEN, NITRITE, LEUKOCYTESUR  Radiological Exams on Admission: DG Ankle 2 Views Right  Result Date: 08/10/2019 CLINICAL DATA:  Wound infection on bottom of foot EXAM: RIGHT FOOT - 2 VIEW; RIGHT ANKLE - 2 VIEW COMPARISON:  Report 01/18/2013 FINDINGS: Right  foot: Diffuse soft tissue edema. No fracture or malalignment within the digits of the right foot. Loss of the heel pad soft tissues presumably due to large ulcer. Lucency and probable mild fragmentation at the plantar surface of the calcaneus. Distorted appearance of the talocalcaneal interval. Potential widened joint space between the navicular and first cuneiform dorsally. Right ankle: Mild periosteal reaction involving the tibial and fibular shaft distally. Diffuse soft tissue edema. Ankle mortise grossly symmetric. Abnormal appearance of calcaneus. Distorted anatomy at the talocalcaneal interval. IMPRESSION: 1. Prominent loss of heel pad soft tissues with lucency, presumably due to large ulceration. There is lucency and cortical fragmentation of the plantar aspect of the calcaneus bone suspicious for osteomyelitis. 2. Poorly defined subtalar joints with distorted appearance of hindfoot anatomy. MRI would better demonstrate extent of suspected osteomyelitic changes. Electronically Signed   By: Donavan Foil M.D.   On: 08/10/2019 19:29   DG Foot 2 Views Right  Result Date: 08/10/2019 CLINICAL DATA:  Wound infection on bottom of foot EXAM: RIGHT FOOT - 2 VIEW; RIGHT ANKLE - 2 VIEW COMPARISON:  Report 01/18/2013 FINDINGS: Right foot: Diffuse soft tissue edema. No fracture or malalignment within the digits of the right foot. Loss of the heel pad soft tissues presumably due to large ulcer. Lucency and probable mild fragmentation at the plantar surface of the calcaneus. Distorted appearance of the talocalcaneal interval. Potential widened joint space between the navicular and first cuneiform dorsally. Right ankle: Mild periosteal reaction involving the tibial and fibular shaft distally. Diffuse soft tissue edema. Ankle mortise grossly symmetric. Abnormal appearance of calcaneus. Distorted anatomy at the talocalcaneal interval. IMPRESSION: 1. Prominent loss of heel pad soft tissues with lucency, presumably due to  large ulceration. There is lucency and cortical fragmentation of the plantar aspect of the calcaneus bone suspicious for osteomyelitis. 2. Poorly defined subtalar joints with distorted appearance of hindfoot anatomy. MRI would better demonstrate extent of suspected osteomyelitic changes. Electronically Signed   By: Jasmine Pang M.D.   On: 08/10/2019 19:29      Assessment/Plan Principal Problem:   Type 2 diabetes mellitus with foot ulcer (HCC) Patient had bad wound at right lower extremity and x-ray is showing possible osteomyelitis. General surgery consulted for evaluation and possible debridement Continue IV vancomycin and IV cefepime Continue ketorolac for pain. Sliding scale insulin ordered for diabetes. Patient is advised about medication compliance.  Active Problems:   OBESITY Patient needs to be on low-carb healthy diet.  Patient states that he mostly eat junk food.      Heroin abuse [facility]) Patient counseled about stop using heroin.    DVT prophylaxis: Lovenox Code Status: Full code  Consults called: General surgery  Admission status: Inpatient-telemetry   Thalia Party MD [facility] Pager [phone]  If 7PM-7AM, please contact night-coverage [email or web address] Password   08/11/2019, 6:50 AM

## 2019-08-11 NOTE — Progress Notes (Addendum)
Same day note  Patient seen and examined at bedside.  Patient was admitted to the hospital for wounds of the lower extremity  At the time of my evaluation, patient complains of lower extremity pain nausea  Physical examination reveals morbidly obese male with bilateral lower extremity necrotic ulceration edema.  Upper extremity ulcerations.  Laboratory data and imaging was reviewed  Assessment and Plan.  Diabetes mellitus with necrotic ulcerations of the right lower extremity.  X-ray of the right lower extremity showing possible osteomyelitis.  General surgery consulted.  Continue IV vancomycin and cefepime.  Continue sliding scale insulin.  Accu-Cheks.  Diabetic diet.  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  Patient feels  sick.  Requesting narcotics.  Patient uses oxycodone at home and states that he is not allergic to it.  Will start on low-dose oxycodone today.  Closely watch for withdrawal.  Put the patient on symptomatic treatment for withdrawal.  No Charge  Signed,  Tenny Craw, MD [facility]  Addendum:  08/11/2019 10:42 AM   Due to necrotic ulceration osteomyelitis and involvement of the foot orthopedics was consulted.  Spoke with Dr. Everardo Pacific who recommended transfer to [facility].  Dr. Lajoyce Corners will follow the patient when the patient is at [facility].  We will put the patient for transfer.

## 2019-08-11 NOTE — Progress Notes (Signed)
Patient ID: Kurt Butler, male   DOB: Apr 06, 1971, 49 y.o.   MRN: [id number] Anticipate transfer to [facility] today.  I will reserve space on the [facility]  operating room schedule for possible surgery tomorrow for the right lower extremity.  I will evaluate patient at [facility] once transferred.

## 2019-08-11 NOTE — Progress Notes (Signed)
Spoke to Dr. Welton Flakes patient given both Toradol and morphine during the shift spoke with the patient about oxycodone allergy and that Dr. Stann Mainland not order that medication because of that. I advises the patient to stay until the MD rounds this morning patient says he does not know he just can't feel this sick.

## 2019-08-11 NOTE — Progress Notes (Signed)
Dr. 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 Dr. Welton Flakes wanted patient to receive a one time dose of clonidine and morphine. Patient had a allergy to oxycodone and Dr. paged back to ask if he wanted patient on  A different medications. Patient ask prior if he had a reaction to morphine or any palpitations patient denied. MD made aware and was told that. MD always aware that pharmacy was spoken to who than told me to clarify with the doctor. MD put in the morphine order and says to go ahead and give.

## 2019-08-11 NOTE — Progress Notes (Signed)
Paged Dr. Craige Cotta to ask for clonidine for withdrawals and said could not order anything  Says the g admitting doctor has not finished putting in orders and there is no assessment so can't order anything at this time.

## 2019-08-11 NOTE — Progress Notes (Signed)
Inpatient Diabetes Program Recommendations  AACE/ADA: New Consensus Statement on Inpatient Glycemic Control (2015)  Target Ranges:  Prepandial:   less than 140 mg/dL      Peak postprandial:   less than 180 mg/dL (1-2 hours)      Critically ill patients:  140 - 180 mg/dL   Lab Results  Component Value Date   GLUCAP 178 (H) 08/11/2019   HGBA1C 9.8 (H) 08/11/2019    Review of Glycemic Control  Diabetes history: DM2 Outpatient Diabetes medications: none, previously on metformin 2000 mg QAM, Amaryl 4 mg QD Current orders for Inpatient glycemic control: Novolog 0-20 units tidwc and 0-5 units QHS  HgbA1C - 9.8%. Was 7.3% on 10/21/17. Will likely need to go home on insulin, d/t needing tight glycemic control for healing.  Inpatient Diabetes Program Recommendations:     Will speak with pt this afternoon about his HgbA1C of 9.8% and importance of getting blood sugars in good control. Will need to f/u with PCP for diabetes management.   Thank you. Ailene Ards, RD, LDN, CDE Inpatient Diabetes Coordinator [phone]

## 2019-08-11 NOTE — Consult Note (Signed)
WOC Nurse Consult Note: Reason for Consult: Bilateral foot plantar aspect ulcerations, full thickness, R>L. Charcot foot deformities.  Right plantar foot with calcaneous palpable. Right anterior LE with full thickness ulcer and 5th digit with blood-filled blister.  Left LE with lateral partial thickness skin loss. I am assisted in my assessment today by [facility] and patient's Bedside RN, Dahlia Client.  Her expertise and flexibility is appreciated. The Bedside RN additionally medicated the patient for pain during my assessment and wound dressing changes. Wound type: Infectious, neuropathic Pressure Injury POA: N/A  Measurements: Left plantar foot at heel:  6cm x 5cm x 0.2cm pale pink, non granulating full thickness wound with closed wound edges. Moderate amount of serous exudate. Left lateral LE: 15cm x 12cm x 0.1cm  Right foot, 5th lateral digit:  Intact blood-filled blister measuring 3cm x 2.5cm Right plantar foot:  20cm x 11cm ulceration with depth unknown due to the presence of necrotic tissue. Distal third of wound is the most severe area of tissue destruction and reveals the calcaneous.  Drainage is serosanguinous, in a large amount and with a strong odor consistent with nonviable tissue. This wound requires evaluation by a physician. Right anterior foot: 7cm x 4cm x 0.2cm full thickness wound with dried serum, pale pink wound bed, nongranulating wound bed and closed wound edges. Moderate amount serosanguinous drainage. Bilateral hands with numerous areas of skin loss (dry full thickness wounds, R>L): Dry, crusted wounds with poor hygiene evident. Edematous. No evidence of infection.  Wound bed: As described above Drainage (amount, consistency, odor) As described above Periwound: As described above Dressing procedure/placement/frequency: Today I will provide conservative wound care guidance for topical care for these wounds via the Orders. Additionally, I have provided a Prevalon boot for pressure  redistribution for the left LE and this will also correct lateral rotation onto the area of tissue loss. A silicone foam dressing is provided to the sacrum to prevent tissue injury.  Patient is not likely to be mobilizing in bed due to the lower extremity wounds and his reported level of discomfort.  Recommend Orthopedic consultation for consideration of limb salvageability vs maintenance wound care for the right lower extremity/foot as well as input on the LLE and bilateral hands.  WOC nursing team will not follow, but will remain available to this patient, the nursing and medical teams.  Please re-consult if needed. Thanks, Ladona Mow, MSN, RN, GNP, [place]  Pager# [phone]

## 2019-08-11 NOTE — Progress Notes (Signed)
MD put in order for patient to transport to [facility].  Bed request placed.  Patient assigned to 5N18.  Report called to Merrill, Charity fundraiser.  Patient transported via [facility].  Levora Angel, RN

## 2019-08-11 NOTE — H&P (View-Only) (Signed)
ORTHOPAEDIC CONSULTATION  REQUESTING PHYSICIAN: Pokhrel, Laxman, MD  Chief Complaint: Nausea and vomiting with necrotic ulcer right foot and heroin withdrawal  HPI: Kurt Butler is a 49 y.o. male who presents with osteomyelitis abscess and ulceration of the right hindfoot.  Patient states he has been in extreme pain his doctor has cut off his pain medicine and patient has been snorting heroin.  Past medical history significant for type 2 diabetes arthritis drug abuse and hypertension.  Social history patient states he lives at home with spouse and child.    Social History   Socioeconomic History  . Marital status: Married    Spouse name: Not on file  . Number of children: Not on file  . Years of education: Not on file  . Highest education level: Not on file  Occupational History  . Not on file  Tobacco Use  . Smoking status: Never Smoker  . Smokeless tobacco: Never Used  Substance and Sexual Activity  . Alcohol use: Not Currently  . Drug use: Yes    Comment: Sniffs heroin  . Sexual activity: Not Currently  Other Topics Concern  . Not on file  Social History Narrative  . Not on file   Social Determinants of Health   Financial Resource Strain:   . Difficulty of Paying Living Expenses: Not on file  Food Insecurity:   . Worried About Charity fundraiser in the Last Year: Not on file  . Ran Out of Food in the Last Year: Not on file  Transportation Needs:   . Lack of Transportation (Medical): Not on file  . Lack of Transportation (Non-Medical): Not on file  Physical Activity:   . Days of Exercise per Week: Not on file  . Minutes of Exercise per Session: Not on file  Stress:   . Feeling of Stress : Not on file  Social Connections:   . Frequency of Communication with Friends and Family: Not on file  . Frequency of Social Gatherings with Friends and Family: Not on file  . Attends Religious Services: Not on file  . Active Member of Clubs or Organizations: Not on  file  . Attends Archivist Meetings: Not on file  . Marital Status: Not on file   No family history on file. - negative except otherwise stated in the family history section Allergies  Allergen Reactions  . Oxycodone Palpitations   Prior to Admission medications   Not on File   DG Ankle 2 Views Right  Result Date: 08/10/2019 CLINICAL DATA:  Wound infection on bottom of foot EXAM: RIGHT FOOT - 2 VIEW; RIGHT ANKLE - 2 VIEW COMPARISON:  Report 01/18/2013 FINDINGS: Right foot: Diffuse soft tissue edema. No fracture or malalignment within the digits of the right foot. Loss of the heel pad soft tissues presumably due to large ulcer. Lucency and probable mild fragmentation at the plantar surface of the calcaneus. Distorted appearance of the talocalcaneal interval. Potential widened joint space between the navicular and first cuneiform dorsally. Right ankle: Mild periosteal reaction involving the tibial and fibular shaft distally. Diffuse soft tissue edema. Ankle mortise grossly symmetric. Abnormal appearance of calcaneus. Distorted anatomy at the talocalcaneal interval. IMPRESSION: 1. Prominent loss of heel pad soft tissues with lucency, presumably due to large ulceration. There is lucency and cortical fragmentation of the plantar aspect of the calcaneus bone suspicious for osteomyelitis. 2. Poorly defined subtalar joints with distorted appearance of hindfoot anatomy. MRI would better demonstrate extent of suspected osteomyelitic changes.  Electronically Signed   By: Jasmine Pang M.D.   On: 08/10/2019 19:29   DG Foot 2 Views Right  Result Date: 08/10/2019 CLINICAL DATA:  Wound infection on bottom of foot EXAM: RIGHT FOOT - 2 VIEW; RIGHT ANKLE - 2 VIEW COMPARISON:  Report 01/18/2013 FINDINGS: Right foot: Diffuse soft tissue edema. No fracture or malalignment within the digits of the right foot. Loss of the heel pad soft tissues presumably due to large ulcer. Lucency and probable mild  fragmentation at the plantar surface of the calcaneus. Distorted appearance of the talocalcaneal interval. Potential widened joint space between the navicular and first cuneiform dorsally. Right ankle: Mild periosteal reaction involving the tibial and fibular shaft distally. Diffuse soft tissue edema. Ankle mortise grossly symmetric. Abnormal appearance of calcaneus. Distorted anatomy at the talocalcaneal interval. IMPRESSION: 1. Prominent loss of heel pad soft tissues with lucency, presumably due to large ulceration. There is lucency and cortical fragmentation of the plantar aspect of the calcaneus bone suspicious for osteomyelitis. 2. Poorly defined subtalar joints with distorted appearance of hindfoot anatomy. MRI would better demonstrate extent of suspected osteomyelitic changes. Electronically Signed   By: Jasmine Pang M.D.   On: 08/10/2019 19:29   - pertinent xrays, CT, MRI studies were reviewed and independently interpreted  Positive ROS: All other systems have been reviewed and were otherwise negative with the exception of those mentioned in the HPI and as above.  Physical Exam: General: Alert, no acute distress Psychiatric: Patient is competent for consent with normal mood and affect Lymphatic: No axillary or cervical lymphadenopathy Cardiovascular: No pedal edema Respiratory: No cyanosis, no use of accessory musculature GI: No organomegaly, abdomen is soft and non-tender    Images:  @ENCIMAGES @  Labs:  Lab Results  Component Value Date   HGBA1C 9.8 (H) 08/11/2019    Lab Results  Component Value Date   ALBUMIN 1.9 (L) 08/11/2019   ALBUMIN 2.0 (L) 08/10/2019    Neurologic: Patient does not have protective sensation bilateral lower extremities.   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  Examination the right lower extremity patient has large necrotic ulcerations on the leg distal and the mid third of the leg.  There is a large necrotic purulent drainage ulcer of the right heel with exposed necrotic heel bone.  Review of the radiographs shows chronic destructive changes of the calcaneus on the right.  Patient does not have palpable pulses bilaterally this may be due to his swelling.  Patient is nauseated and vomiting.  Patient has uncontrolled type 2 diabetes with a hemoglobin A1c of 9.8 and severe protein caloric malnutrition with an albumin of 1.9.  Assessment: Assessment: Uncontrolled type 2 diabetes with severe protein caloric malnutrition with gangrene abscess and osteomyelitis of the right hindfoot and right leg with ulceration on the left hindfoot and left leg as well.  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  History of snorting heroin.  Plan: Plan: Discussed with the patient that we will need to proceed with a right transtibial amputation tomorrow.  Ideally we can unload the left foot and get this to heal.  Discussed risks of surgery including nonhealing of the wound due to the massive lymphatic and  venous insufficiency risk of aspiration with surgery risk of death need for additional surgery.  Patient states he understands and wishes to proceed at this time.  Thank you for the consult and the opportunity to see Mr. Enberg  Marquette Piontek, MD [facility] [phone] 11:17 AM     

## 2019-08-11 NOTE — Progress Notes (Signed)
Patient received morphine, clonidine, and phenergan for nausea .

## 2019-08-11 NOTE — Progress Notes (Signed)
Page Dr. Welton Flakes pt  crying and upset saying he is sick. Wanting his Roxicodone or wants to go home. refused the antibiotic just ordered.

## 2019-08-12 ENCOUNTER — Encounter (HOSPITAL_COMMUNITY)
Admission: EM | Disposition: A | Discharge status: Home Health | Payer: Self-pay | Source: Home / Self Care | Attending: Internal Medicine

## 2019-08-12 ENCOUNTER — Inpatient Hospital Stay (HOSPITAL_COMMUNITY): Payer: Medicare Other | Admitting: Registered Nurse

## 2019-08-12 ENCOUNTER — Encounter (HOSPITAL_COMMUNITY): Payer: Self-pay | Admitting: Internal Medicine

## 2019-08-12 HISTORY — PX: AMPUTATION: SHX166

## 2019-08-12 LAB — HEMOGLOBIN AND HEMATOCRIT, BLOOD
HCT: 26 % — ABNORMAL LOW (ref 39.0–52.0)
HCT: 31.8 % — ABNORMAL LOW (ref 39.0–52.0)
Hemoglobin: 7.9 g/dL — ABNORMAL LOW (ref 13.0–17.0)
Hemoglobin: 9.5 g/dL — ABNORMAL LOW (ref 13.0–17.0)

## 2019-08-12 LAB — GLUCOSE, CAPILLARY
Glucose-Capillary: 126 mg/dL — ABNORMAL HIGH (ref 70–99)
Glucose-Capillary: 122 mg/dL — ABNORMAL HIGH (ref 70–99)
Glucose-Capillary: 121 mg/dL — ABNORMAL HIGH (ref 70–99)
Glucose-Capillary: 127 mg/dL — ABNORMAL HIGH (ref 70–99)
Glucose-Capillary: 141 mg/dL — ABNORMAL HIGH (ref 70–99)
Glucose-Capillary: 238 mg/dL — ABNORMAL HIGH (ref 70–99)

## 2019-08-12 LAB — BASIC METABOLIC PANEL
Anion gap: 10 (ref 5–15)
BUN: 7 mg/dL (ref 6–20)
CO2: 27 mmol/L (ref 22–32)
Calcium: 7.5 mg/dL — ABNORMAL LOW (ref 8.9–10.3)
Chloride: 97 mmol/L — ABNORMAL LOW (ref 98–111)
Creatinine, Ser: 0.72 mg/dL (ref 0.61–1.24)
GFR calc Af Amer: >60 mL/min (ref >60)
GFR calc non Af Amer: >60 mL/min (ref >60)
Glucose, Bld: 135 mg/dL — ABNORMAL HIGH (ref 70–99)
Potassium: 3.2 mmol/L — ABNORMAL LOW (ref 3.5–5.1)
Sodium: 134 mmol/L — ABNORMAL LOW (ref 135–145)

## 2019-08-12 LAB — CBC
HCT: 25.4 % — ABNORMAL LOW (ref 39.0–52.0)
Hemoglobin: 7.7 g/dL — ABNORMAL LOW (ref 13.0–17.0)
MCH: 21.8 pg — ABNORMAL LOW (ref 26.0–34.0)
MCHC: 30.3 g/dL (ref 30.0–36.0)
MCV: 71.8 fL — ABNORMAL LOW (ref 80.0–100.0)
Platelets: 600 10*3/uL — ABNORMAL HIGH (ref 150–400)
RBC: 3.54 MIL/uL — ABNORMAL LOW (ref 4.22–5.81)
RDW: 15.8 % — ABNORMAL HIGH (ref 11.5–15.5)
WBC: 14.4 10*3/uL — ABNORMAL HIGH (ref 4.0–10.5)
nRBC: 0 % (ref 0.0–0.2)

## 2019-08-12 LAB — MAGNESIUM: Magnesium: 1.6 mg/dL — ABNORMAL LOW (ref 1.7–2.4)

## 2019-08-12 SURGERY — AMPUTATION BELOW KNEE
Anesthesia: Regional | Site: Knee | Laterality: Right

## 2019-08-12 MED ORDER — HYDROMORPHONE HCL 1 MG/ML IJ SOLN: 0.5000 mg | INTRAMUSCULAR | Status: DC | PRN

## 2019-08-12 MED ORDER — HYDROMORPHONE HCL 1 MG/ML IJ SOLN
INTRAMUSCULAR | Status: DC | PRN
  Administered 2019-08-12: 1 mg via INTRAVENOUS

## 2019-08-12 MED ORDER — ACETAMINOPHEN 325 MG PO TABS: 325.0000 mg | ORAL_TABLET | Freq: Four times a day (QID) | ORAL | Status: DC | PRN

## 2019-08-12 MED ORDER — POTASSIUM CHLORIDE 20 MEQ PO PACK: 40.0000 meq | PACK | Freq: Once | ORAL | Status: DC

## 2019-08-12 MED ORDER — HYDROMORPHONE HCL 1 MG/ML IJ SOLN
0.2500 mg | INTRAMUSCULAR | Status: DC | PRN
  Administered 2019-08-12: 0.5 mg via INTRAVENOUS

## 2019-08-12 MED ORDER — HYDROMORPHONE HCL 1 MG/ML IJ SOLN
INTRAMUSCULAR | Status: AC
  Filled 2019-08-12: qty 1

## 2019-08-12 MED ORDER — HYDROMORPHONE HCL 1 MG/ML IJ SOLN
INTRAMUSCULAR | Status: AC
  Administered 2019-08-12: 0.5 mg via INTRAVENOUS
  Filled 2019-08-12: qty 1

## 2019-08-12 MED ORDER — ROCURONIUM BROMIDE 50 MG/5ML IV SOSY
PREFILLED_SYRINGE | INTRAVENOUS | Status: DC | PRN
  Administered 2019-08-12: 100 mg via INTRAVENOUS

## 2019-08-12 MED ORDER — MIDAZOLAM HCL 5 MG/5ML IJ SOLN
INTRAMUSCULAR | Status: DC | PRN
  Administered 2019-08-12: 2 mg via INTRAVENOUS

## 2019-08-12 MED ORDER — FENTANYL CITRATE (PF) 250 MCG/5ML IJ SOLN
INTRAMUSCULAR | Status: AC
  Filled 2019-08-12: qty 5

## 2019-08-12 MED ORDER — PROPOFOL 10 MG/ML IV BOLUS
INTRAVENOUS | Status: AC
  Filled 2019-08-12: qty 20

## 2019-08-12 MED ORDER — ROCURONIUM BROMIDE 10 MG/ML (PF) SYRINGE
PREFILLED_SYRINGE | INTRAVENOUS | Status: AC
  Filled 2019-08-12: qty 10

## 2019-08-12 MED ORDER — CEFAZOLIN SODIUM-DEXTROSE 2-4 GM/100ML-% IV SOLN
2.0000 g | Freq: Four times a day (QID) | INTRAVENOUS | Status: AC
  Administered 2019-08-12 – 2019-08-13 (×3): 2 g via INTRAVENOUS
  Filled 2019-08-12 (×3): qty 100

## 2019-08-12 MED ORDER — LIDOCAINE 2% (20 MG/ML) 5 ML SYRINGE
INTRAMUSCULAR | Status: AC
  Filled 2019-08-12: qty 5

## 2019-08-12 MED ORDER — BUPIVACAINE HCL (PF) 0.5 % IJ SOLN
INTRAMUSCULAR | Status: DC | PRN
  Administered 2019-08-12: 10 mL
  Administered 2019-08-12: 15 mL

## 2019-08-12 MED ORDER — DEXAMETHASONE SODIUM PHOSPHATE 10 MG/ML IJ SOLN
INTRAMUSCULAR | Status: AC
  Filled 2019-08-12: qty 1

## 2019-08-12 MED ORDER — SUGAMMADEX SODIUM 200 MG/2ML IV SOLN
INTRAVENOUS | Status: DC | PRN
  Administered 2019-08-12: 200 mg via INTRAVENOUS

## 2019-08-12 MED ORDER — POTASSIUM CHLORIDE 10 MEQ/100ML IV SOLN: 10.0000 meq | INTRAVENOUS | Status: DC

## 2019-08-12 MED ORDER — BUPIVACAINE LIPOSOME 1.3 % IJ SUSP
INTRAMUSCULAR | Status: DC | PRN
  Administered 2019-08-12 (×2): 10 mL via PERINEURAL

## 2019-08-12 MED ORDER — ONDANSETRON HCL 4 MG/2ML IJ SOLN
INTRAMUSCULAR | Status: DC | PRN
  Administered 2019-08-12: 4 mg via INTRAVENOUS

## 2019-08-12 MED ORDER — PROPOFOL 10 MG/ML IV BOLUS
INTRAVENOUS | Status: DC | PRN
  Administered 2019-08-12: 200 mg via INTRAVENOUS

## 2019-08-12 MED ORDER — LIDOCAINE 2% (20 MG/ML) 5 ML SYRINGE
INTRAMUSCULAR | Status: DC | PRN
  Administered 2019-08-12: 100 mg via INTRAVENOUS

## 2019-08-12 MED ORDER — DEXMEDETOMIDINE HCL IN NACL 400 MCG/100ML IV SOLN
INTRAVENOUS | Status: DC | PRN
  Administered 2019-08-12 (×2): 20 ug via INTRAVENOUS

## 2019-08-12 MED ORDER — DOCUSATE SODIUM 100 MG PO CAPS
100.0000 mg | ORAL_CAPSULE | Freq: Two times a day (BID) | ORAL | Status: DC
  Administered 2019-08-12 – 2019-08-14 (×3): 100 mg via ORAL
  Filled 2019-08-12 (×4): qty 1

## 2019-08-12 MED ORDER — MAGNESIUM SULFATE 4 GM/100ML IV SOLN
4.0000 g | INTRAVENOUS | Status: AC
  Administered 2019-08-12: 4 g via INTRAVENOUS
  Filled 2019-08-12: qty 100

## 2019-08-12 MED ORDER — DEXAMETHASONE SODIUM PHOSPHATE 10 MG/ML IJ SOLN
INTRAMUSCULAR | Status: DC | PRN
  Administered 2019-08-12: 10 mg via INTRAVENOUS

## 2019-08-12 MED ORDER — CHLORHEXIDINE GLUCONATE 4 % EX LIQD
60.0000 mL | Freq: Once | CUTANEOUS | Status: AC
  Administered 2019-08-12: 4 via TOPICAL

## 2019-08-12 MED ORDER — MIDAZOLAM HCL 2 MG/2ML IJ SOLN
INTRAMUSCULAR | Status: AC
  Filled 2019-08-12: qty 2

## 2019-08-12 MED ORDER — POTASSIUM CHLORIDE 10 MEQ/100ML IV SOLN
10.0000 meq | INTRAVENOUS | Status: AC
  Administered 2019-08-12 (×4): 10 meq via INTRAVENOUS
  Filled 2019-08-12 (×3): qty 100

## 2019-08-12 MED ORDER — POTASSIUM CHLORIDE CRYS ER 20 MEQ PO TBCR: 40.0000 meq | EXTENDED_RELEASE_TABLET | Freq: Two times a day (BID) | ORAL | Status: DC

## 2019-08-12 MED ORDER — 0.9 % SODIUM CHLORIDE (POUR BTL) OPTIME
TOPICAL | Status: DC | PRN
  Administered 2019-08-12: 15:00:00 1000 mL

## 2019-08-12 MED ORDER — KETAMINE HCL 10 MG/ML IJ SOLN
INTRAMUSCULAR | Status: DC | PRN
  Administered 2019-08-12: 25 mg via INTRAVENOUS
  Administered 2019-08-12: 50 mg via INTRAVENOUS

## 2019-08-12 MED ORDER — FENTANYL CITRATE (PF) 100 MCG/2ML IJ SOLN
INTRAMUSCULAR | Status: DC | PRN
  Administered 2019-08-12: 50 ug via INTRAVENOUS
  Administered 2019-08-12 (×2): 100 ug via INTRAVENOUS

## 2019-08-12 MED ORDER — ONDANSETRON HCL 4 MG/2ML IJ SOLN
INTRAMUSCULAR | Status: AC
  Filled 2019-08-12: qty 2

## 2019-08-12 MED ORDER — KETAMINE HCL 50 MG/5ML IJ SOSY
PREFILLED_SYRINGE | INTRAMUSCULAR | Status: AC
  Filled 2019-08-12: qty 5

## 2019-08-12 MED ORDER — FENTANYL CITRATE (PF) 100 MCG/2ML IJ SOLN: 25.0000 ug | INTRAMUSCULAR | Status: DC | PRN

## 2019-08-12 MED ORDER — SODIUM CHLORIDE 0.9 % IV SOLN: INTRAVENOUS | Status: DC

## 2019-08-12 MED ORDER — LACTATED RINGERS IV SOLN: INTRAVENOUS | Status: DC

## 2019-08-12 MED ORDER — POTASSIUM CHLORIDE 10 MEQ/100ML IV SOLN
10.0000 meq | INTRAVENOUS | Status: DC
  Filled 2019-08-12: qty 100

## 2019-08-12 MED ORDER — DEXTROSE 5 % IV SOLN
3.0000 g | INTRAVENOUS | Status: AC
  Administered 2019-08-12: 3 g via INTRAVENOUS
  Filled 2019-08-12: qty 3000
  Filled 2019-08-12: qty 3

## 2019-08-12 SURGICAL SUPPLY — 42 items
BLADE SAW RECIP 87.9 MT (BLADE) ×3 IMPLANT
BLADE SURG 21 STRL SS (BLADE) ×3 IMPLANT
BNDG COHESIVE 6X5 TAN STRL LF (GAUZE/BANDAGES/DRESSINGS) ×2 IMPLANT
CANISTER WOUND CARE 500ML ATS (WOUND CARE) ×3 IMPLANT
COVER SURGICAL LIGHT HANDLE (MISCELLANEOUS) ×3 IMPLANT
COVER WAND RF STERILE (DRAPES) IMPLANT
CUFF TOURN SGL QUICK 34 (TOURNIQUET CUFF) ×3
CUFF TOURN SGL QUICK 42 (TOURNIQUET CUFF) ×2 IMPLANT
CUFF TRNQT CYL 34X4.125X (TOURNIQUET CUFF) ×1 IMPLANT
DRAPE INCISE IOBAN 66X45 STRL (DRAPES) ×3 IMPLANT
DRAPE U-SHAPE 47X51 STRL (DRAPES) ×3 IMPLANT
DRESSING PREVENA PLUS CUSTOM (GAUZE/BANDAGES/DRESSINGS) ×1 IMPLANT
DRSG PREVENA PLUS CUSTOM (GAUZE/BANDAGES/DRESSINGS) ×3
DURAPREP 26ML APPLICATOR (WOUND CARE) ×5 IMPLANT
ELECT REM PT RETURN 9FT ADLT (ELECTROSURGICAL) ×3
ELECTRODE REM PT RTRN 9FT ADLT (ELECTROSURGICAL) ×1 IMPLANT
GLOVE BIOGEL PI IND STRL 9 (GLOVE) ×1 IMPLANT
GLOVE BIOGEL PI INDICATOR 9 (GLOVE) ×2
GLOVE SURG ORTHO 9.0 STRL STRW (GLOVE) ×3 IMPLANT
GOWN STRL REUS W/ TWL XL LVL3 (GOWN DISPOSABLE) ×2 IMPLANT
GOWN STRL REUS W/TWL XL LVL3 (GOWN DISPOSABLE) ×6
KIT BASIN OR (CUSTOM PROCEDURE TRAY) ×3 IMPLANT
KIT TURNOVER KIT B (KITS) ×3 IMPLANT
MANIFOLD NEPTUNE II (INSTRUMENTS) ×3 IMPLANT
NS IRRIG 1000ML POUR BTL (IV SOLUTION) ×3 IMPLANT
PACK ORTHO EXTREMITY (CUSTOM PROCEDURE TRAY) ×3 IMPLANT
PAD ARMBOARD 7.5X6 YLW CONV (MISCELLANEOUS) ×3 IMPLANT
PREVENA RESTOR ARTHOFORM 46X30 (CANNISTER) ×3 IMPLANT
SPONGE LAP 18X18 RF (DISPOSABLE) ×4 IMPLANT
STAPLER VISISTAT 35W (STAPLE) ×4 IMPLANT
STOCKINETTE IMPERVIOUS LG (DRAPES) ×3 IMPLANT
SUT ETHILON 2 0 PSLX (SUTURE) ×6 IMPLANT
SUT SILK 2 0 (SUTURE) ×3
SUT SILK 2 0 TIES 10X30 (SUTURE) ×2 IMPLANT
SUT SILK 2-0 18XBRD TIE 12 (SUTURE) ×1 IMPLANT
SUT VIC AB 1 CTX 27 (SUTURE) ×6 IMPLANT
SUT VIC AB 1 CTX 36 (SUTURE) ×6
SUT VIC AB 1 CTX36XBRD ANBCTRL (SUTURE) IMPLANT
TOWEL GREEN STERILE (TOWEL DISPOSABLE) ×3 IMPLANT
TUBE CONNECTING 12'X1/4 (SUCTIONS) ×1
TUBE CONNECTING 12X1/4 (SUCTIONS) ×2 IMPLANT
YANKAUER SUCT BULB TIP NO VENT (SUCTIONS) ×3 IMPLANT

## 2019-08-12 NOTE — Transfer of Care (Signed)
Immediate Anesthesia Transfer of Care Note  Patient: Kurt Butler  Procedure(s) Performed: RIGHT BELOW KNEE AMPUTATION (Right Knee)  Patient Location: PACU  Anesthesia Type:GA combined with regional for post-op pain  Level of Consciousness: awake and patient cooperative  Airway & Oxygen Therapy: Patient Spontanous Breathing and Patient connected to face mask oxygen  Post-op Assessment: Report given to RN and Post -op Vital signs reviewed and stable  Post vital signs: Reviewed and stable  Last Vitals:  Vitals Value Taken Time  BP 152/93 08/12/19 1627  Temp    Pulse 101 08/12/19 1632  Resp 9 08/12/19 1632  SpO2 100 % 08/12/19 1632  Vitals shown include unvalidated device data.  Last Pain:  Vitals:   08/12/19 1406  TempSrc: Oral  PainSc:       Patients Stated Pain Goal: 2 (08/11/19 2224)  Complications: No apparent anesthesia complications

## 2019-08-12 NOTE — Interval H&P Note (Signed)
History and Physical Interval Note:  08/12/2019 6:46 AM  Kurt Butler  has presented today for surgery, with the diagnosis of Osteomyelitis Right Foot.  The various methods of treatment have been discussed with the patient and family. After consideration of risks, benefits and other options for treatment, the patient has consented to  Procedure(s): RIGHT BELOW KNEE AMPUTATION (Right) as a surgical intervention.  The patient's history has been reviewed, patient examined, no change in status, stable for surgery.  I have reviewed the patient's chart and labs.  Questions were answered to the patient's satisfaction.     Nadara Mustard

## 2019-08-12 NOTE — Anesthesia Procedure Notes (Addendum)
Anesthesia Regional Block: Popliteal block   Pre-Anesthetic Checklist: ,, timeout performed, Correct Patient, Correct Site, Correct Laterality, Correct Procedure, Correct Position, site marked, Risks and benefits discussed,  Surgical consent,  Pre-op evaluation,  At surgeon's request and post-op pain management  Laterality: Right  Prep: Maximum Sterile Barrier Precautions used, chloraprep       Needles:  Injection technique: Single-shot  Needle Type: Echogenic Stimulator Needle     Needle Length: 9cm  Needle Gauge: 22     Additional Needles:   Procedures:,,,, ultrasound used (permanent image in chart),,,,  Narrative:  Start time: 08/12/2019 4:00 PM End time: 08/12/2019 4:10 PM Injection made incrementally with aspirations every 5 mL.  Performed by: Personally  Anesthesiologist: Elmer Picker, MD  Additional Notes: Monitors applied. No increased pain on injection. No increased resistance to injection. Injection made in 5cc increments. Good needle visualization. Patient tolerated procedure well.

## 2019-08-12 NOTE — Anesthesia Procedure Notes (Signed)
Procedure Name: Intubation Date/Time: 08/12/2019 3:12 PM Performed by: Rosiland Oz, CRNA Pre-anesthesia Checklist: Patient identified, Emergency Drugs available, Suction available, Patient being monitored and Timeout performed Patient Re-evaluated:Patient Re-evaluated prior to induction Oxygen Delivery Method: Circle system utilized Preoxygenation: Pre-oxygenation with 100% oxygen Induction Type: IV induction Ventilation: Mask ventilation without difficulty Laryngoscope Size: Miller and 3 Grade View: Grade I Tube type: Oral Tube size: 7.5 mm Number of attempts: 1 Airway Equipment and Method: Stylet Placement Confirmation: ETT inserted through vocal cords under direct vision,  positive ETCO2 and breath sounds checked- equal and bilateral Secured at: 22 cm Tube secured with: Tape Dental Injury: Teeth and Oropharynx as per pre-operative assessment

## 2019-08-12 NOTE — Op Note (Signed)
   Date of Surgery: 08/12/2019  INDICATIONS: Mr. Mayotte is a 49 y.o.-year-old male who presents with necrotic ulcer right heel with foul-smelling drainage and chronic osteomyelitis with venous insufficiency uncontrolled type 2 diabetes with severe protein caloric malnutrition.Marland Kitchen  PREOPERATIVE DIAGNOSIS: Osteomyelitis ulceration abscess right heel with venous insufficiency  POSTOPERATIVE DIAGNOSIS: Same.  PROCEDURE: Transtibial amputation Application of Prevena wound VAC  SURGEON: Lajoyce Corners, M.D.  ANESTHESIA:  general  IV FLUIDS AND URINE: See anesthesia.  ESTIMATED BLOOD LOSS: Minimal mL.  COMPLICATIONS: None.  DESCRIPTION OF PROCEDURE: The patient was brought to the operating room and underwent a general anesthetic. After adequate levels of anesthesia were obtained patient's lower extremity was prepped using DuraPrep draped into a sterile field. A timeout was called. The foot was draped out of the sterile field with impervious stockinette. A transverse incision was made 11 cm distal to the tibial tubercle. This curved proximally and a large posterior flap was created. The tibia was transected 1 cm proximal to the skin incision. The fibula was transected just proximal to the tibial incision. The tibia was beveled anteriorly. A large posterior flap was created. The sciatic nerve was pulled cut and allowed to retract. The vascular bundles were suture ligated with 2-0 silk. The deep and superficial fascial layers were closed using #1 Vicryl. The skin was closed using staples and 2-0 nylon. The wound was covered with a Prevena wound VAC. There was a good suction fit. A prosthetic shrinker was applied. Patient was extubated taken to the PACU in stable condition.   DISCHARGE PLANNING:  Antibiotic duration: Continue IV antibiotics for 24 hours  Weightbearing: Nonweightbearing on the right  Pain medication: Opioid pathway with Dilaudid  Dressing care/ Wound VAC: Continue wound VAC for 1  week  Discharge to: Home versus skilled nursing   [facility].  For prosthetic  Follow-up: In the office 1 week post operative.  Aldean Baker, MD [facility] 4:09 PM

## 2019-08-12 NOTE — Progress Notes (Signed)
PROGRESS NOTE    Kurt Butler  DZH:[id number]  DOB: [birth year]  PCP: Patient, No Pcp Per Admit date:08/10/2019  49 y.o. male with medical history significant of DM, HTN, drug abuse and arthritis presented to ED for evaluation of draining wounds in right lower extremity.  Patient reported using heroin to help with chronic pain .  Patient reports severe pain and worsening drainage from right heel as well as wound on right lateral aspect of lower extremity associated with occasional fever, chills,  nausea with vomiting. ED Course:  Tmax 99.8, blood pressure 136/66, heart rate between 99-103, respiratory rate 21 and oxygen saturation 99% on room air.  Lab work revealed WBC 16.9, platelets 611, hemoglobin 8.7, sodium 128, potassium 4.1, chloride 90, bicarb 29 and BG 279.  X-ray of right lower extremity was consistent with osteomyelitis and patient was started on IV vancomycin/cefepime Hospital course: Patient admitted to [facility] for further evaluation and management with orthopedic consultation.  Repeat labs on 2/25 showed improvement in sodium level to 134 and WBC at 16.2, hemoglobin 8.5.  Labs today show improvement in white count to 14 K but drop in hemoglobin to 7.7 and persistent thrombocytosis with platelets at 600.  Potassium low at 3.2.  Upon orthopedics recommendation, patient transferred to [facility] campus and seen by Dr. Lajoyce Corners who is planning on right transtibial amputation today given concern for right hindfoot osteomyelitis and abscess.  Subjective: Seen postoperatively.  Tolerated procedure well. Objective: Vitals:   08/11/19 1334 08/11/19 1941 08/12/19 0421 08/12/19 0745  BP: (!) 152/47 123/66 127/69 (!) 158/77  Pulse: (!) 109 99 (!) 108 99  Resp: 17 20 20 18   Temp: 98.2 F (36.8 C) 98.7 F (37.1 C) 98.6 F (37 C) 99 F (37.2 C)  TempSrc: Oral Oral Oral Oral  SpO2: 100% 99%  99%  Weight:      Height:        Intake/Output Summary (Last 24 hours) at 08/12/2019 0951 Last data filed at  08/12/2019 0600 Gross per 24 hour  Intake 3818.49 ml  Output 1150 ml  Net 2668.49 ml   Filed Weights   08/10/19 1845  Weight: (!) 163.3 kg    Physical Examination:  General exam: Appears calm and comfortable  Respiratory system: Clear to auscultation. Respiratory effort normal. Cardiovascular system: S1 & S2 heard, RRR. No JVD, chronic lymphedema  Gastrointestinal system: Abdomen is nondistended, soft and nontender. Normal bowel sounds heard. Central nervous system: Alert and oriented. No new focal neurological deficits. Extremities: No contractures, edema or joint deformities.  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 Mood & affect appropriate.   Data Reviewed: I have personally reviewed following labs and imaging studies  CBC: Recent Labs  Lab 08/10/19 1920 08/11/19 0523 08/12/19 0508  WBC 16.9* 16.2* 14.4*  NEUTROABS 12.4*  --   --   HGB 8.7* 8.5* 7.7*  HCT 29.0* 28.2* 25.4*  MCV 73.2* 72.5* 71.8*  PLT 611* 582* 600*   Basic Metabolic Panel: Recent Labs  Lab 08/10/19 1920 08/11/19 0523 08/12/19 0508  NA 128* 134* 134*  K 4.1 4.4 3.2*  CL 90* 95* 97*  CO2 29 29 27   GLUCOSE 279* 175* 135*  BUN 11 10 7   CREATININE 0.67 0.70 0.72  CALCIUM 8.5* 8.4* 7.5*  MG  --   --  1.6*   GFR: Estimated Creatinine Clearance: 179 mL/min (by C-G formula based on SCr of 0.72 mg/dL). Liver Function Tests: Recent Labs  Lab 08/10/19 1920 08/11/19 0523  AST 13* 24  ALT 12 12  ALKPHOS 194* 170*  BILITOT 0.3 1.1  PROT 8.4* 7.8  ALBUMIN 2.0* 1.9*   No results for input(s): LIPASE, AMYLASE in the last 168 hours. No results for input(s): AMMONIA in the last 168 hours. Coagulation Profile: No results for input(s): INR, PROTIME in the last 168 hours. Cardiac Enzymes: No results  for input(s): CKTOTAL, CKMB, CKMBINDEX, TROPONINI in the last 168 hours. BNP (last 3 results) No results for input(s): PROBNP in the last 8760 hours. HbA1C: Recent Labs    08/11/19 0010  HGBA1C 9.8*   CBG: Recent Labs  Lab 08/11/19 0804 08/11/19 1152 08/11/19 1633 08/11/19 2142 08/12/19 0640  GLUCAP 178* 175* 178* 155* 126*   Lipid Profile: No results for input(s): CHOL, HDL, LDLCALC, TRIG, CHOLHDL, LDLDIRECT in the last 72 hours. Thyroid Function Tests: No results for input(s): TSH, T4TOTAL, FREET4, T3FREE, THYROIDAB in the last 72 hours. Anemia Panel: No results for input(s): VITAMINB12, FOLATE, FERRITIN, TIBC, IRON, RETICCTPCT in the last 72 hours. Sepsis Labs: Recent Labs  Lab 08/10/19 1920  LATICACIDVEN 1.6    Recent Results (from the past 240 hour(s))  Culture, blood (Routine X 2) w Reflex to ID Panel     Status: None (Preliminary result)   Collection Time: 08/10/19  7:20 PM   Specimen: BLOOD  Result Value Ref Range Status   Specimen Description BLOOD LEFT ANTECUBITAL  Final   Special Requests   Final    BOTTLES DRAWN AEROBIC AND ANAEROBIC Blood Culture adequate volume Performed at [facility], [address], [place], [place] [address]    Culture NO GROWTH < 24 HOURS  Final   Report Status PENDING  Incomplete  Culture, blood (Routine X 2) w Reflex to ID Panel     Status: None (Preliminary result)   Collection Time: 08/10/19  7:20 PM   Specimen: BLOOD  Result Value Ref Range Status   Specimen Description BLOOD RIGHT WRIST  Final   Special Requests   Final    BOTTLES DRAWN AEROBIC AND ANAEROBIC Blood Culture adequate volume Performed at [facility], [address], [place], [place] [address]    Culture NO GROWTH < 24 HOURS  Final   Report Status PENDING  Incomplete  SARS CORONAVIRUS 2 (TAT 6-24 HRS) Nasopharyngeal Nasopharyngeal Swab     Status: None   Collection Time: 08/10/19  9:02 PM   Specimen: Nasopharyngeal  Swab  Result Value Ref Range Status   SARS Coronavirus 2 NEGATIVE NEGATIVE Final    Comment: (NOTE) SARS-CoV-2 target nucleic acids are NOT DETECTED. The SARS-CoV-2 RNA is generally detectable in upper and lower respiratory specimens during the acute phase of infection. Negative results do not preclude SARS-CoV-2 infection, do not rule out co-infections with other pathogens, and should not be used as the sole basis for treatment or other patient management decisions. Negative results must be combined with clinical observations, patient history, and epidemiological information. The expected result is Negative. Fact Sheet for Patients: HairSlick.no Fact Sheet for Healthcare Providers: quierodirigir.com This test is not yet approved or cleared by the [place] FDA and  has been authorized for detection and/or diagnosis of SARS-CoV-2 by FDA under an Emergency Use Authorization (EUA). This EUA will remain  in effect (meaning this test can be used) for  the duration of the COVID-19 declaration under Section 56 4(b)(1) of the Act, 21 U.S.C. section 360bbb-3(b)(1), unless the authorization is terminated or revoked sooner. Performed at [facility], [place] [address] [place], [place] [address]       Radiology Studies: DG Ankle 2 Views Right  Result Date: 08/10/2019 CLINICAL DATA:  Wound infection on bottom of foot EXAM: RIGHT FOOT - 2 VIEW; RIGHT ANKLE - 2 VIEW COMPARISON:  Report 01/18/2013 FINDINGS: Right foot: Diffuse soft tissue edema. No fracture or malalignment within the digits of the right foot. Loss of the heel pad soft tissues presumably due to large ulcer. Lucency and probable mild fragmentation at the plantar surface of the calcaneus. Distorted appearance of the talocalcaneal interval. Potential widened joint space between the navicular and first cuneiform dorsally. Right ankle: Mild periosteal reaction involving the  tibial and fibular shaft distally. Diffuse soft tissue edema. Ankle mortise grossly symmetric. Abnormal appearance of calcaneus. Distorted anatomy at the talocalcaneal interval. IMPRESSION: 1. Prominent loss of heel pad soft tissues with lucency, presumably due to large ulceration. There is lucency and cortical fragmentation of the plantar aspect of the calcaneus bone suspicious for osteomyelitis. 2. Poorly defined subtalar joints with distorted appearance of hindfoot anatomy. MRI would better demonstrate extent of suspected osteomyelitic changes. Electronically Signed   By: Donavan Foil M.D.   On: 08/10/2019 19:29   DG Foot 2 Views Right  Result Date: 08/10/2019 CLINICAL DATA:  Wound infection on bottom of foot EXAM: RIGHT FOOT - 2 VIEW; RIGHT ANKLE - 2 VIEW COMPARISON:  Report 01/18/2013 FINDINGS: Right foot: Diffuse soft tissue edema. No fracture or malalignment within the digits of the right foot. Loss of the heel pad soft tissues presumably due to large ulcer. Lucency and probable mild fragmentation at the plantar surface of the calcaneus. Distorted appearance of the talocalcaneal interval. Potential widened joint space between the navicular and first cuneiform dorsally. Right ankle: Mild periosteal reaction involving the tibial and fibular shaft distally. Diffuse soft tissue edema. Ankle mortise grossly symmetric. Abnormal appearance of calcaneus. Distorted anatomy at the talocalcaneal interval. IMPRESSION: 1. Prominent loss of heel pad soft tissues with lucency, presumably due to large ulceration. There is lucency and cortical fragmentation of the plantar aspect of the calcaneus bone suspicious for osteomyelitis. 2. Poorly defined subtalar joints with distorted appearance of hindfoot anatomy. MRI would better demonstrate extent of suspected osteomyelitic changes. Electronically Signed   By: Donavan Foil M.D.   On: 08/10/2019 19:29        Scheduled Meds: . dicyclomine  20 mg Oral TID AC & HS  .  insulin aspart  0-20 Units Subcutaneous TID WC  . insulin aspart  0-5 Units Subcutaneous QHS  . pantoprazole  40 mg Oral Daily   Continuous Infusions: . sodium chloride 1,000 mL (08/10/19 1921)  . sodium chloride 100 mL/hr at 08/11/19 0037  .  ceFAZolin (ANCEF) IV    . cefTRIAXone (ROCEPHIN)  IV Stopped (08/11/19 1556)  . metronidazole Stopped (08/12/19 0540)  . vancomycin 1,500 mg (08/12/19 0917)   Assessment/Plan:  1.  Right lower extremity necrotic wounds with underlying abscess/osteomyelitis: Seen by orthopedics and underwent right transtibial amputation today.  On IV vancomycin and cefepime.  Leukocytosis downtrending.  Continue antibiotics x24 hours per orthopedics.  Placed on wound VAC and will need follow-up with Dr. Sharol Given in 1 week  2.  Microcytic anemia: His hemoglobin was already low preoperatively at 7.7.  Expected intraoperative blood loss.Monitor hemoglobin and transfuse as  needed for counts less than 7.  Patient presented with leukocytosis in the setting of infection as well as thrombocytosis.  Monitor for now-if persistent thrombocytosis with anemia, may need to rule out myelodysplastic syndrome.  3.  Diabetes mellitus with complications including diabetic foot ulcers: Carb modified diet, sliding scale insulin.  Blood glucose so far maintaining less than 200. Hgb A1c 9.8  4.  Hyponatremia: Present on admission and likely related to dehydration has improved with IV fluids.  Sodium now at 134.  5. IV heroin abuse: Counseled regarding risks and advised to quit.  Watch for signs of withdrawal.  Currently on oxycodone and added IV morphine for pain management perioperatively.  Watch for signs of withdrawal.  6. Hypokalemia: Replace as needed.  7.  Thrombocytosis: In the setting of acute infection.  Monitor for now.  DVT prophylaxis: Lovenox when cleared by Ortho Code Status: Full code Family / Patient Communication: Discussed with patient Disposition Plan:   Patient is from  home prior to hospitalization. Received/Receiving inpatient care for diabetic foot ulcers/osteomyelitis/abscess requiring operative intervention and right transtibial amputation Discharge when off antibiotics and cleared by orthopedics.  May need rehab.     LOS: 2 days    Time spent: 35 minutes    Alessandra Bevels, MD [facility] Pager in [place]  If 7PM-7AM, please contact night-coverage [email or web address] 08/12/2019, 9:51 AM

## 2019-08-12 NOTE — Anesthesia Procedure Notes (Signed)
Anesthesia Regional Block: Adductor canal block   Pre-Anesthetic Checklist: ,, timeout performed, Correct Patient, Correct Site, Correct Laterality, Correct Procedure, Correct Position, site marked, Risks and benefits discussed,  Surgical consent,  Pre-op evaluation,  At surgeon's request and post-op pain management  Laterality: Right  Prep: Maximum Sterile Barrier Precautions used, chloraprep       Needles:  Injection technique: Single-shot  Needle Type: Echogenic Stimulator Needle     Needle Length: 9cm  Needle Gauge: 22     Additional Needles:   Procedures:,,,, ultrasound used (permanent image in chart),,,,  Narrative:  Start time: 08/12/2019 4:10 PM End time: 08/12/2019 4:20 PM Injection made incrementally with aspirations every 5 mL.  Performed by: Personally  Anesthesiologist: Elmer Picker, MD  Additional Notes: Monitors applied. No increased pain on injection. No increased resistance to injection. Injection made in 5cc increments. Good needle visualization. Patient tolerated procedure well.

## 2019-08-12 NOTE — Anesthesia Preprocedure Evaluation (Addendum)
Anesthesia Evaluation  Patient identified by MRN, date of birth, ID band Patient awake    Reviewed: Allergy & Precautions, NPO status , Patient's Chart, lab work & pertinent test results  Airway Mallampati: III  TM Distance: >3 FB Neck ROM: Full  Mouth opening: Limited Mouth Opening  Dental no notable dental hx. (+) Poor Dentition, Dental Advisory Given   Pulmonary shortness of breath,    Pulmonary exam normal breath sounds clear to auscultation       Cardiovascular hypertension, negative cardio ROS Normal cardiovascular exam Rhythm:Regular Rate:Normal     Neuro/Psych negative neurological ROS  negative psych ROS   GI/Hepatic GERD  ,(+)     substance abuse  IV drug use,   Endo/Other  diabetesMorbid obesity  Renal/GU negative Renal ROS  negative genitourinary   Musculoskeletal  (+) Arthritis ,   Abdominal   Peds  Hematology  (+) Blood dyscrasia (Hgb 7.9), ,   Anesthesia Other Findings   Reproductive/Obstetrics                           Anesthesia Physical Anesthesia Plan  ASA: III  Anesthesia Plan: General and Regional   Post-op Pain Management:  Regional for Post-op pain   Induction: Intravenous  PONV Risk Score and Plan: 2 and Midazolam, Dexamethasone and Ondansetron  Airway Management Planned: Oral ETT  Additional Equipment:   Intra-op Plan:   Post-operative Plan: Extubation in OR  Informed Consent: I have reviewed the patients History and Physical, chart, labs and discussed the procedure including the risks, benefits and alternatives for the proposed anesthesia with the patient or authorized representative who has indicated his/her understanding and acceptance.     Dental advisory given  Plan Discussed with: CRNA  Anesthesia Plan Comments:         Anesthesia Quick Evaluation

## 2019-08-13 LAB — GLUCOSE, CAPILLARY
Glucose-Capillary: 198 mg/dL — ABNORMAL HIGH (ref 70–99)
Glucose-Capillary: 246 mg/dL — ABNORMAL HIGH (ref 70–99)

## 2019-08-13 LAB — CBC
HCT: 27.3 % — ABNORMAL LOW (ref 39.0–52.0)
Hemoglobin: 8.3 g/dL — ABNORMAL LOW (ref 13.0–17.0)
MCH: 21.8 pg — ABNORMAL LOW (ref 26.0–34.0)
MCHC: 30.4 g/dL (ref 30.0–36.0)
MCV: 71.7 fL — ABNORMAL LOW (ref 80.0–100.0)
Platelets: 610 10*3/uL — ABNORMAL HIGH (ref 150–400)
RBC: 3.81 MIL/uL — ABNORMAL LOW (ref 4.22–5.81)
RDW: 15.9 % — ABNORMAL HIGH (ref 11.5–15.5)
WBC: 11.7 10*3/uL — ABNORMAL HIGH (ref 4.0–10.5)
nRBC: 0 % (ref 0.0–0.2)

## 2019-08-13 LAB — BASIC METABOLIC PANEL
Anion gap: 10 (ref 5–15)
BUN: 12 mg/dL (ref 6–20)
CO2: 23 mmol/L (ref 22–32)
Calcium: 7.1 mg/dL — ABNORMAL LOW (ref 8.9–10.3)
Chloride: 99 mmol/L (ref 98–111)
Creatinine, Ser: 0.65 mg/dL (ref 0.61–1.24)
GFR calc Af Amer: >60 mL/min (ref >60)
GFR calc non Af Amer: >60 mL/min (ref >60)
Glucose, Bld: 227 mg/dL — ABNORMAL HIGH (ref 70–99)
Potassium: 4 mmol/L (ref 3.5–5.1)
Sodium: 132 mmol/L — ABNORMAL LOW (ref 135–145)

## 2019-08-13 MED ORDER — BACITRACIN ZINC 500 UNIT/GM EX OINT
TOPICAL_OINTMENT | Freq: Two times a day (BID) | CUTANEOUS | Status: DC
  Administered 2019-08-13: 1 via TOPICAL
  Filled 2019-08-13: qty 28.4

## 2019-08-13 MED ORDER — OXYCODONE HCL 5 MG PO TABS
15.0000 mg | ORAL_TABLET | Freq: Four times a day (QID) | ORAL | Status: DC | PRN
  Administered 2019-08-13 – 2019-08-14 (×4): 15 mg via ORAL
  Filled 2019-08-13 (×4): qty 3

## 2019-08-13 MED ORDER — VANCOMYCIN HCL 1500 MG/300ML IV SOLN
1500.0000 mg | Freq: Three times a day (TID) | INTRAVENOUS | Status: DC
  Administered 2019-08-13 – 2019-08-14 (×3): 1500 mg via INTRAVENOUS
  Filled 2019-08-13 (×3): qty 300

## 2019-08-13 NOTE — Progress Notes (Signed)
Patient ID: Kurt Butler, male   DOB: [birth year], 49 y.o.   MRN: [id number] Postoperative day 1 right transtibial amputation.  Patient complains of global pain.  He states he is normally on 15 mg of oxycodone on as an outpatient.  He states that he is not allergic to oxycodone he states he is allergic to hydrocodone.  There is no drainage in the wound VAC canister patient expressed desire for discharge to home.  Patient may discharge to home once he is safe with physical therapy with a walker.  Patient will discharge with the portable Praveena wound VAC pump.

## 2019-08-13 NOTE — Evaluation (Signed)
Occupational Therapy Evaluation Patient Details Name: Kurt Butler MRN: [id number] DOB: [birth year] Today's Date: 08/13/2019    History of Present Illness Pt is a 49 y/o male admitted secondary to nausea, vomiting and necrotic ulcer of the R foot. Pt is now s/p R transtibial amputation. PMH including but not limited to drug abuse, DM and HTN.   Clinical Impression   This 49 yo male admitted with above presents to acute OT with PLOF of Mod I to independent with basic ADLs and now is Mod A for LB ADLs and toileting due to now with BKA, increased pain, and other leg is weak. He will benefit from acute OT with follow up HHOT.    Follow Up Recommendations  Home health OT;Supervision/Assistance - 24 hour(pt would no agree to CIR)    Equipment Recommendations  Other (comment)(Drop arm bari commode)       Precautions / Restrictions Precautions Precautions: Fall Precaution Comments: wound VAC Restrictions Weight Bearing Restrictions: Yes RLE Weight Bearing: Non weight bearing      Mobility Bed Mobility Overal bed mobility: Needs Assistance Bed Mobility: Supine to Sit     Supine to sit: Supervision     General bed mobility comments: for safety  Transfers Overall transfer level: Needs assistance Equipment used: Rolling walker (2 wheeled);None Transfers: Sit to/from Stand;Lateral/Scoot Transfers          Lateral/Scoot Transfers: Min guard;+2 safety/equipment General transfer comment: initially attempted a sit<>stand from EOB with RW and physical assistance of two; however, pt very anxious and did not feel he would be able to perform. Agreeable to lateral scoot to drop arm recliner chair towards his L side, which he was able to perform without any physical assistance    Balance   Sitting-balance support: Feet supported Sitting balance-Leahy Scale: Good                                     ADL either performed or assessed with clinical judgement   ADL  Overall ADL's : Needs assistance/impaired Eating/Feeding: Independent;Sitting   Grooming: Set up;Sitting   Upper Body Bathing: Set up;Sitting   Lower Body Bathing: Moderate assistance;Sitting/lateral leans Lower Body Bathing Details (indicate cue type and reason): or bed level Upper Body Dressing : Set up;Sitting   Lower Body Dressing: Moderate assistance;Sitting/lateral leans;Bed level   Toilet Transfer: Min Pension scheme manager Details (indicate cue type and reason): lateral scoot to the left from bed to drop arm recliner Toileting- Clothing Manipulation and Hygiene: Moderate assistance;Sitting/lateral lean Toileting - Clothing Manipulation Details (indicate cue type and reason): bed level             Vision Patient Visual Report: No change from baseline              Pertinent Vitals/Pain Pain Assessment: Faces Faces Pain Scale: Hurts little more Pain Location: R residual limb Pain Descriptors / Indicators: Sore Pain Intervention(s): Monitored during session;Repositioned     Hand Dominance Right   Extremity/Trunk Assessment Upper Extremity Assessment Upper Extremity Assessment: Overall WFL for tasks assessed           Communication Communication Communication: No difficulties   Cognition Arousal/Alertness: Awake/alert Behavior During Therapy: Restless;Impulsive Overall Cognitive Status: Impaired/Different from baseline Area of Impairment: Safety/judgement                         Safety/Judgement: Decreased awareness of safety;Decreased awareness  of deficits                    Home Living Family/patient expects to be discharged to:: Private residence Living Arrangements: Spouse/significant other Available Help at Discharge: Family;Friend(s);Available 24 hours/day Type of Home: House Home Access: Level entry     Home Layout: One level     Bathroom Shower/Tub: Other (comment)(bird baths)         Home Equipment: Walker - 2  wheels;Cane - single point;Crutches;Wheelchair - manual   Additional Comments: w/c is his step-daughter's      Prior Functioning/Environment Level of Independence: Independent                 OT Problem List: Impaired balance (sitting and/or standing);Decreased strength;Pain;Obesity      OT Treatment/Interventions: Self-care/ADL training;DME and/or AE instruction;Patient/family education;Balance training    OT Goals(Current goals can be found in the care plan section) Acute Rehab OT Goals Patient Stated Goal: "go home" OT Goal Formulation: With patient Time For Goal Achievement: 08/27/19 Potential to Achieve Goals: Good  OT Frequency: Min 2X/week           Co-evaluation PT/OT/SLP Co-Evaluation/Treatment: Yes Reason for Co-Treatment: For patient/therapist safety;To address functional/ADL transfers PT goals addressed during session: Mobility/safety with mobility;Balance;Strengthening/ROM;Proper use of DME OT goals addressed during session: ADL's and self-care;Strengthening/ROM      AM-PAC OT "6 Clicks" Daily Activity     Outcome Measure Help from another person eating meals?: None Help from another person taking care of personal grooming?: A Little Help from another person toileting, which includes using toliet, bedpan, or urinal?: A Lot Help from another person bathing (including washing, rinsing, drying)?: A Lot Help from another person to put on and taking off regular upper body clothing?: A Little Help from another person to put on and taking off regular lower body clothing?: A Lot 6 Click Score: 16   End of Session Equipment Utilized During Treatment: Gait belt;Rolling walker Nurse Communication: Mobility status  Activity Tolerance: Patient tolerated treatment well Patient left: in chair;with call bell/phone within reach;with chair alarm set  OT Visit Diagnosis: Other abnormalities of gait and mobility (R26.89);Muscle weakness (generalized) (M62.81);Pain Pain  - Right/Left: Right Pain - part of body: Leg                Time: 4970-2637 OT Time Calculation (min): 28 min Charges:  OT General Charges $OT Visit: 1 Visit OT Evaluation $OT Eval Moderate Complexity: 1 Mod  Cathy OTR/L Acute [employer] Pager [phone] Office [phone]     08/13/2019, 9:05 PM

## 2019-08-13 NOTE — Progress Notes (Signed)
Heart monitor off he is refusing to have it replaced- on call phys notified

## 2019-08-13 NOTE — Evaluation (Signed)
Physical Therapy Evaluation Patient Details Name: Kurt Butler MRN: [id number] DOB: August 09, 1970 Today's Date: 08/13/2019   History of Present Illness  Pt is a 49 y/o male admitted secondary to nausea, vomiting and necrotic ulcer of the R foot. Pt is now s/p R transtibial amputation. PMH including but not limited to drug abuse, DM and HTN.    Clinical Impression  Pt presented supine in bed with HOB elevated, awake and willing to participate in therapy session. Prior to admission, pt reported that he was independent with all functional mobility and ADLs. Pt lives with his wife and stepdaughter (who uses a w/c for mobility) in a single level home with a level entry. At the time of evaluation, pt able to perform bed mobility with supervision and completed a lateral scoot transfer with min guard for safety. Initially wanted to attempt to stand from EOB with RW and two person assist; however, he became anxious and declined. Pt would continue to benefit from skilled physical therapy services at this time while admitted and after d/c to address the below listed limitations in order to improve overall safety and independence with functional mobility.     Follow Up Recommendations Home health PT;Other (comment)(refusing CIR; adamant about returning home)    Equipment Recommendations  Wheelchair (measurements PT);Wheelchair cushion (measurements PT);Other (comment)(BARI DROP ARM BSC; amputee leg rest pad/attachment)    Recommendations for Other Services       Precautions / Restrictions Precautions Precautions: Fall Precaution Comments: wound VAC Restrictions Weight Bearing Restrictions: Yes RLE Weight Bearing: Non weight bearing      Mobility  Bed Mobility Overal bed mobility: Needs Assistance Bed Mobility: Supine to Sit     Supine to sit: Supervision     General bed mobility comments: for safety  Transfers Overall transfer level: Needs assistance Equipment used: Rolling walker (2  wheeled);None Transfers: Sit to/from Stand;Lateral/Scoot Transfers          Lateral/Scoot Transfers: Min guard;+2 safety/equipment General transfer comment: initially attempted a sit<>stand from EOB with RW and physical assistance of two; however, pt very anxious and did not feel he would be able to perform. Agreeable to lateral scoot to drop arm recliner chair towards his L side, which he was able to perform without any physical assistance  Ambulation/Gait                Stairs            Wheelchair Mobility    Modified Rankin (Stroke Patients Only)       Balance Overall balance assessment: Needs assistance Sitting-balance support: Feet supported Sitting balance-Leahy Scale: Good                                       Pertinent Vitals/Pain Pain Assessment: Faces Faces Pain Scale: Hurts little more Pain Location: R residual limb Pain Descriptors / Indicators: Sore Pain Intervention(s): Monitored during session;Repositioned    Home Living Family/patient expects to be discharged to:: Private residence Living Arrangements: Spouse/significant other Available Help at Discharge: Family;Friend(s);Available 24 hours/day Type of Home: House Home Access: Level entry     Home Layout: One level Home Equipment: Walker - 2 wheels;Cane - single point;Crutches;Wheelchair - manual Additional Comments: w/c is his step-daughter's    Prior Function Level of Independence: Independent               Hand Dominance  Extremity/Trunk Assessment   Upper Extremity Assessment Upper Extremity Assessment: Defer to OT evaluation    Lower Extremity Assessment Lower Extremity Assessment: RLE deficits/detail RLE Deficits / Details: pt with decreased strength and ROM limitations secondary to post-op pain and weakness. Wound VAC in place to distal aspect RLE: Unable to fully assess due to pain       Communication   Communication: No difficulties   Cognition Arousal/Alertness: Awake/alert Behavior During Therapy: Restless;Impulsive Overall Cognitive Status: Impaired/Different from baseline Area of Impairment: Safety/judgement                         Safety/Judgement: Decreased awareness of safety;Decreased awareness of deficits            General Comments      Exercises     Assessment/Plan    PT Assessment Patient needs continued PT services  PT Problem List Decreased strength;Decreased range of motion;Decreased activity tolerance;Decreased balance;Decreased mobility;Decreased coordination;Decreased cognition;Decreased knowledge of use of DME;Decreased safety awareness;Decreased knowledge of precautions;Pain       PT Treatment Interventions DME instruction;Gait training;Stair training;Functional mobility training;Therapeutic exercise;Therapeutic activities;Balance training;Neuromuscular re-education;Patient/family education    PT Goals (Current goals can be found in the Care Plan section)  Acute Rehab PT Goals Patient Stated Goal: "go home" PT Goal Formulation: With patient Time For Goal Achievement: 08/27/19 Potential to Achieve Goals: Fair    Frequency Min 5X/week   Barriers to discharge        Co-evaluation PT/OT/SLP Co-Evaluation/Treatment: Yes Reason for Co-Treatment: For patient/therapist safety;To address functional/ADL transfers PT goals addressed during session: Balance;Mobility/safety with mobility;Proper use of DME;Strengthening/ROM         AM-PAC PT "6 Clicks" Mobility  Outcome Measure Help needed turning from your back to your side while in a flat bed without using bedrails?: None Help needed moving from lying on your back to sitting on the side of a flat bed without using bedrails?: None Help needed moving to and from a bed to a chair (including a wheelchair)?: A Little Help needed standing up from a chair using your arms (e.g., wheelchair or bedside chair)?: Total Help needed to  walk in hospital room?: Total Help needed climbing 3-5 steps with a railing? : Total 6 Click Score: 14    End of Session Equipment Utilized During Treatment: Gait belt Activity Tolerance: Patient tolerated treatment well Patient left: in chair;with call bell/phone within reach;with chair alarm set Nurse Communication: Mobility status PT Visit Diagnosis: Other abnormalities of gait and mobility (R26.89);Pain Pain - Right/Left: Right Pain - part of body: Leg    Time: 1334-1405 PT Time Calculation (min) (ACUTE ONLY): 31 min   Charges:   PT Evaluation $PT Eval Moderate Complexity: 1 Mod          Eduard Clos, PT, DPT  Acute Rehabilitation Services Pager [phone] Office [place] 08/13/2019, 3:32 PM

## 2019-08-13 NOTE — Progress Notes (Signed)
Progress Note for W/C  Patient is s/p R transtibial amputation which impairs their ability to perform daily activities in the home.  A walker alone will not resolve the issues with performing activities of daily living. A wheelchair will allow patient to safely perform daily activities.  The patient can self propel in the home or has a caregiver who can provide assistance.     Arletta Bale, DPT  Acute Rehabilitation Services Pager [phone] Office [phone]

## 2019-08-13 NOTE — Progress Notes (Signed)
PROGRESS NOTE    Kurt Butler  QMG:[id number]  DOB: [birth year]  PCP: Patient, No Pcp Per Admit date:08/10/2019   Patient is a 49 year old male, morbidly obese, with past medical history significant for DM, HTN, drug abuse and arthritis.  Patient was admitted with right heel abscess/ulceration/osteomyelitis venous insufficiency.  Patient has undergone right transtibial amputation by the orthopedic team.  Leukocytosis is resolving.    08/13/2019: Patient seen.  Orthopedic input is appreciated.  Await PT OT evaluation.  No new complaints.  Disposition will depend on PT OT recommendations.  Consult TOC team.  Likely discharge in the next 24-48 hours.  Subjective: Seen postoperatively.  Tolerated procedure well. Objective: Vitals:   08/12/19 2353 08/13/19 0430 08/13/19 0748 08/13/19 1433  BP: 118/72 (!) 155/91 (!) 148/85 (!) 145/88  Pulse: 93 90 84 80  Resp: 20 20 18 17   Temp: 98.5 F (36.9 C) 98.1 F (36.7 C) 98.6 F (37 C) 98.6 F (37 C)  TempSrc:   Oral Oral  SpO2:   100% 100%  Weight:      Height:        Intake/Output Summary (Last 24 hours) at 08/13/2019 1508 Last data filed at 08/13/2019 1300 Gross per 24 hour  Intake 3184.6 ml  Output 2250 ml  Net 934.6 ml   Filed Weights   08/10/19 1845 08/12/19 1438  Weight: (!) 163.3 kg (!) 163.3 kg    Physical Examination:  General exam: Appears calm and comfortable.  Patient is morbidly obese. Respiratory system: Decreased air entry globally.   Cardiovascular system: S1 & S2 heard, RRR.  Gastrointestinal system: Abdomen is morbidly obese, soft and nontender. Normal bowel sounds heard. Central nervous system: Awake and alert.  Patient moves all extremities.   Extremities: Status post right BKA.  Chronic edema of the left lower extremity.    Data Reviewed: I have personally reviewed following labs and imaging studies  CBC: Recent Labs  Lab 08/10/19 1920 08/10/19 1920 08/11/19 0523 08/12/19 0508 08/12/19 1059  08/12/19 1928 08/13/19 0307  WBC 16.9*  --  16.2* 14.4*  --   --  11.7*  NEUTROABS 12.4*  --   --   --   --   --   --   HGB 8.7*   < > 8.5* 7.7* 7.9* 9.5* 8.3*  HCT 29.0*   < > 28.2* 25.4* 26.0* 31.8* 27.3*  MCV 73.2*  --  72.5* 71.8*  --   --  71.7*  PLT 611*  --  582* 600*  --   --  610*   < > = values in this interval not displayed.   Basic Metabolic Panel: Recent Labs  Lab 08/10/19 1920 08/11/19 0523 08/12/19 0508 08/13/19 0307  NA 128* 134* 134* 132*  K 4.1 4.4 3.2* 4.0  CL 90* 95* 97* 99  CO2 29 29 27 23   GLUCOSE 279* 175* 135* 227*  BUN 11 10 7 12   CREATININE 0.67 0.70 0.72 0.65  CALCIUM 8.5* 8.4* 7.5* 7.1*  MG  --   --  1.6*  --    GFR: Estimated Creatinine Clearance: 179 mL/min (by C-G formula based on SCr of 0.65 mg/dL). Liver Function Tests: Recent Labs  Lab 08/10/19 1920 08/11/19 0523  AST 13* 24  ALT 12 12  ALKPHOS 194* 170*  BILITOT 0.3 1.1  PROT 8.4* 7.8  ALBUMIN 2.0* 1.9*   No results for input(s): LIPASE, AMYLASE in the last 168 hours. No results for input(s): AMMONIA in the last 168 hours.  Coagulation Profile: No results for input(s): INR, PROTIME in the last 168 hours. Cardiac Enzymes: No results for input(s): CKTOTAL, CKMB, CKMBINDEX, TROPONINI in the last 168 hours. BNP (last 3 results) No results for input(s): PROBNP in the last 8760 hours. HbA1C: Recent Labs    08/11/19 0010  HGBA1C 9.8*   CBG: Recent Labs  Lab 08/12/19 1449 08/12/19 1628 08/12/19 1734 08/12/19 2129 08/13/19 0650  GLUCAP 121* 127* 141* 238* 198*   Lipid Profile: No results for input(s): CHOL, HDL, LDLCALC, TRIG, CHOLHDL, LDLDIRECT in the last 72 hours. Thyroid Function Tests: No results for input(s): TSH, T4TOTAL, FREET4, T3FREE, THYROIDAB in the last 72 hours. Anemia Panel: No results for input(s): VITAMINB12, FOLATE, FERRITIN, TIBC, IRON, RETICCTPCT in the last 72 hours. Sepsis Labs: Recent Labs  Lab 08/10/19 1920  LATICACIDVEN 1.6    Recent  Results (from the past 240 hour(s))  Culture, blood (Routine X 2) w Reflex to ID Panel     Status: None (Preliminary result)   Collection Time: 08/10/19  7:20 PM   Specimen: BLOOD  Result Value Ref Range Status   Specimen Description   Final    BLOOD LEFT ANTECUBITAL Performed at [facility], [address] [place], [place] [address]    Special Requests   Final    BOTTLES DRAWN AEROBIC AND ANAEROBIC Blood Culture adequate volume Performed at [facility], [address], [place], [place] [address]    Culture   Final    NO GROWTH 3 DAYS Performed at [facility], [address] [place], [place] [address]    Report Status PENDING  Incomplete  Culture, blood (Routine X 2) w Reflex to ID Panel     Status: None (Preliminary result)   Collection Time: 08/10/19  7:20 PM   Specimen: BLOOD  Result Value Ref Range Status   Specimen Description   Final    BLOOD RIGHT WRIST Performed at [facility], [address] [place], [place] [address]    Special Requests   Final    BOTTLES DRAWN AEROBIC AND ANAEROBIC Blood Culture adequate volume Performed at [facility], [address], [place], [place] [address]    Culture   Final    NO GROWTH 3 DAYS Performed at [facility], [address], [place], [place] [address]    Report Status PENDING  Incomplete  SARS CORONAVIRUS 2 (TAT 6-24 HRS) Nasopharyngeal Nasopharyngeal Swab     Status: None   Collection Time: 08/10/19  9:02 PM   Specimen: Nasopharyngeal Swab  Result Value Ref Range Status   SARS Coronavirus 2 NEGATIVE NEGATIVE Final    Comment: (NOTE) SARS-CoV-2 target nucleic acids are NOT DETECTED. The SARS-CoV-2 RNA is generally detectable in upper and lower respiratory specimens during the acute phase of infection. Negative results do not preclude SARS-CoV-2 infection, do not rule out co-infections with other pathogens, and should not be used as  the sole basis for treatment or other patient management decisions. Negative results must be combined with clinical observations, patient history, and epidemiological information. The expected result is Negative. Fact Sheet for Patients: HairSlick.no Fact Sheet for Healthcare Providers: quierodirigir.com This test is not yet approved or cleared by the [place] FDA and  has been authorized for detection and/or diagnosis of SARS-CoV-2 by FDA under an Emergency Use Authorization (EUA). This EUA will remain  in effect (meaning this test can be used) for the duration of the COVID-19 declaration  under Section 56 4(b)(1) of the Act, 21 U.S.C. section 360bbb-3(b)(1), unless the authorization is terminated or revoked sooner. Performed at [facility], [address] [place], [place] [address]       Radiology Studies: No results found.      Scheduled Meds: . bacitracin   Topical BID  . dicyclomine  20 mg Oral TID AC & HS  . docusate sodium  100 mg Oral BID  . insulin aspart  0-20 Units Subcutaneous TID WC  . insulin aspart  0-5 Units Subcutaneous QHS  . pantoprazole  40 mg Oral Daily   Continuous Infusions: . sodium chloride 1,000 mL (08/10/19 1921)  . sodium chloride 100 mL/hr at 08/11/19 0037  . sodium chloride Stopped (08/13/19 0553)  . cefTRIAXone (ROCEPHIN)  IV 2 g (08/13/19 1238)  . lactated ringers 10 mL/hr at 08/12/19 1501  . metronidazole 500 mg (08/13/19 1312)  . vancomycin     Assessment/Plan:  Right lower extremity necrotic wounds with underlying abscess/osteomyelitis: Seen by orthopedics and underwent right transtibial amputation today.  On IV vancomycin and cefepime.  Leukocytosis downtrending.  Continue antibiotics x24 hours per orthopedics.  Placed on wound VAC and will need follow-up with Dr. Lajoyce Corners in 1 week 08/12/2018: Await PT OT input.  Consult transition of care team.  Likely discharge in the  next 1 to 2 days.  Microcytic anemia: His hemoglobin was already low preoperatively at 7.7.  Expected intraoperative blood loss.Monitor hemoglobin and transfuse as needed for counts less than 7.  Patient presented with leukocytosis in the setting of infection as well as thrombocytosis.  Monitor for now-if persistent thrombocytosis with anemia, may need to rule out myelodysplastic syndrome. 08/13/2019: Continue to monitor hemoglobin closely.  Hemoglobin level today is 8.3 g/dL.  Diabetes mellitus with complications including diabetic foot ulcers: Carb modified diet, sliding scale insulin.  Blood glucose so far maintaining less than 200. Hgb A1c 9.8 08/13/2019: Continue to monitor and optimize.  Hyponatremia: Present on admission and likely related to dehydration has improved with IV fluids.  Sodium now at 134. 08/13/2019:  IV heroin abuse: Counseled regarding risks and advised to quit.  Watch for signs of withdrawal.  Currently on oxycodone and added IV morphine for pain management perioperatively.  Watch for signs of withdrawal.  Hypokalemia: Replace as needed.  Thrombocytosis: In the setting of acute infection.  Monitor for now.  DVT prophylaxis: Lovenox when cleared by Ortho Code Status: Full code Family / Patient Communication: Discussed with patient Disposition Plan:   Patient is from home prior to hospitalization. Received/Receiving inpatient care for diabetic foot ulcers/osteomyelitis/abscess requiring operative intervention and right transtibial amputation Discharge when off antibiotics and cleared by orthopedics.  May need rehab.     LOS: 3 days    Time spent: 35 minutes    Barnetta Chapel, MD [facility] Pager in [place]  If 7PM-7AM, please contact night-coverage [email or web address] 08/13/2019, 3:08 PM

## 2019-08-13 NOTE — Plan of Care (Signed)

## 2019-08-14 LAB — GLUCOSE, CAPILLARY: Glucose-Capillary: 138 mg/dL — ABNORMAL HIGH (ref 70–99)

## 2019-08-14 MED ORDER — DOCUSATE SODIUM 100 MG PO CAPS: 100.0000 mg | ORAL_CAPSULE | Freq: Two times a day (BID) | ORAL | 0 refills | Status: AC

## 2019-08-14 MED ORDER — INSULIN ASPART 100 UNIT/ML ~~LOC~~ SOLN: 0.0000 [IU] | Freq: Every day | SUBCUTANEOUS | 11 refills | Status: DC

## 2019-08-14 MED ORDER — METFORMIN HCL 1000 MG PO TABS: 1000.0000 mg | ORAL_TABLET | Freq: Two times a day (BID) | ORAL | 11 refills | Status: AC

## 2019-08-14 MED ORDER — BACITRACIN ZINC 500 UNIT/GM EX OINT: TOPICAL_OINTMENT | Freq: Two times a day (BID) | CUTANEOUS | 0 refills | Status: DC

## 2019-08-14 MED ORDER — BLOOD GLUCOSE MONITOR KIT: PACK | 0 refills | Status: AC

## 2019-08-14 MED ORDER — BACITRACIN ZINC 500 UNIT/GM EX OINT: TOPICAL_OINTMENT | Freq: Two times a day (BID) | CUTANEOUS | 0 refills | Status: AC

## 2019-08-14 MED ORDER — DOCUSATE SODIUM 100 MG PO CAPS: 100.0000 mg | ORAL_CAPSULE | Freq: Two times a day (BID) | ORAL | 0 refills | Status: DC

## 2019-08-14 MED ORDER — OXYCODONE HCL 15 MG PO TABS: 15.0000 mg | ORAL_TABLET | Freq: Four times a day (QID) | ORAL | 0 refills | Status: DC | PRN

## 2019-08-14 MED ORDER — INSULIN ASPART 100 UNIT/ML ~~LOC~~ SOLN: 0.0000 [IU] | Freq: Three times a day (TID) | SUBCUTANEOUS | 11 refills | Status: DC

## 2019-08-14 NOTE — Discharge Summary (Signed)
Physician Discharge Summary  Patient ID: Kurt Butler MRN: [id number] DOB/AGE: 49-Jan-1972 49 y.o.  Admit date: 08/10/2019 Discharge date: 08/14/2019  Admission Diagnoses:  Discharge Diagnoses:  Principal Problem:   Osteomyelitis/ulceration/abscess right heel with venous insufficiency.   Type 2 diabetes mellitus with foot ulcer ([place]) Active Problems:   Morbid (severe) obesity due to excess calories (H[facility])   Right foot infection   Heroin abuse ([place])   Subacute osteomyelitis, right ankle and foot (HCC)   BMI 45.0-49.9, adult (HCC)   Severe protein-calorie malnutrition (H[facility])   Chronic venous hypertension (idiopathic) with ulcer and inflammation of bilateral lower extremity ([place])   Discharged Condition: stable  Hospital Course: Patient is a 49 year old male, morbidly obese, with past medical history significant for diabetes mellitus, hypertension, drug abuse and arthritis.  Patient was admitted with right heel abscess/ulceration/osteomyelitis venous insufficiency.    Patient was initially started on broad-spectrum antibiotics.  Orthopedic team was consulted.  Patient underwent transtibial amputation with application of Prevena wound VAC on 08/12/2019.  Patient has improved significantly and will be discharged back home to the care of the primary care provider and the orthopedic team.  Right lower extremity necrotic wounds with underlying abscess/osteomyelitis:  -Seen by orthopedics and underwent right transtibial amputation today.   -Patient was initially on IV vancomycin and cefepime.   -Downward trending of leukocytosis noted.   -Patient has completed course of antibiotics as per orthopedic team.   -Patient will follow with primary care provider and orthopedic team on discharge.    Anemia:  -Hemoglobin was 7.7 g/dL preoperatively.   -H/H was monitored postoperatively.   -Continue to monitor hemoglobin on discharge.  Diabetes mellitus with complications including diabetic foot  ulcers:  -HbA1c was 9.8%.   -Need to optimize blood sugar management was discussed with the patient.   -Monitor blood sugar closely on discharge.    Hyponatremia:  -Likely has prerenal component.   -Improved with hydration.   -Sodium prior to discharge was 134.    IV heroin abuse:  -Counseled regarding risks of illicit drug use.  -Advised to quit illicit drug use.    Hypokalemia:  -Electrolytes were monitored and repleted during the hospital stay.    Thrombocytosis:  -Likely reactive in the setting of infection.   Consults: orthopedic surgery  Discharge Exam: Blood pressure (!) 145/89, pulse 79, temperature 97.7 F (36.5 C), temperature source Oral, resp. rate 18, height _0  (1.854 m), weight (!) 163.3 kg, SpO2 98 %.   Disposition: Discharge disposition: 01-Home or Self Care   Discharge Instructions    Diet - low sodium heart healthy   Complete by: As directed    Diet Carb Modified   Complete by: As directed    Increase activity slowly   Complete by: As directed      Allergies as of 08/14/2019      Reactions   Hydrocodone Palpitations   Pt states this has happened in th past.      Medication List    TAKE these medications   bacitracin ointment Apply topically 2 (two) times daily.   blood glucose meter kit and supplies Kit Dispense based on patient and insurance preference. Use up to four times daily as directed. (FOR ICD-9 250.00, 250.01).   docusate sodium 100 MG capsule Commonly known as: COLACE Take 1 capsule (100 mg total) by mouth 2 (two) times daily.   metFORMIN 1000 MG tablet Commonly known as: Glucophage Take 1 tablet (1,000 mg total) by mouth 2 (two) times daily with  a meal.   oxyCODONE 15 MG immediate release tablet Commonly known as: ROXICODONE Take 1 tablet (15 mg total) by mouth every 6 (six) hours as needed for severe pain.            Durable Medical Equipment  (From admission, onward)         Start     Ordered   08/14/19  1205  DME standard manual wheelchair with seat cushion  Once    Comments: Patient suffers from mobility challenges following right transtibial amputation which impairs their ability to perform daily activities like ADLs in the home.  A walking aid will not resolve issue with performing activities of daily living. A wheelchair will allow patient to safely perform daily activities. Patient can safely propel the wheelchair in the home or has a caregiver who can provide assistance. Length of need may vary depending on clinical improvement. Accessories: elevating leg rests (ELRs), wheel locks, extensions and anti-tippers.   08/14/19 1207         Follow-up Information    Newt Minion, MD In 1 week.   Specialty: Orthopedic Surgery Contact information: [address] [place] [address] [phone]           Signed: Bonnell Public 08/14/2019, 12:20 PM

## 2019-08-14 NOTE — Progress Notes (Signed)
Physical Therapy Treatment Patient Details Name: Kurt Butler MRN: [id number] DOB: 04/18/1971 Today's Date: 08/14/2019    History of Present Illness Pt is a 49 y/o male admitted secondary to nausea, vomiting and necrotic ulcer of the R foot. Pt is now s/p R transtibial amputation. PMH including but not limited to drug abuse, DM and HTN.    PT Comments    Continuing work on functional mobility and activity tolerance;  Session focused on safe, functional transfer training, and wheelchair training; Performed lateral scoots towards L and R; Overall not needing physical assist, but guarding assist for safety as he is restless, impulsive, and tends to depend on momentum to move, still putting him at a fall risk.  At this point, as he is insisting on dc home, he is safest at a wheelchair transfer functional level; He tells me he typically sleeps in his recliner, but that he has the option to sleep in a hospital bed -- that way he won't need to stand to clear an armrest for transfers;   He told me and Johnny Bridge, Charity fundraiser, that he has access to a wheelchair accessible Zenaida Niece to travel home.   Follow Up Recommendations  Home health PT;Supervision/Assistance - 24 hour;Other (comment)(HHOT/RN/Aide; declining post-acute rehab)     Equipment Recommendations  Wheelchair (measurements PT);Wheelchair cushion (measurements PT);Other (comment)(BARI DROP ARM BSC; amputee leg rest pad/attachment; sliding board, bari RW)    Recommendations for Other Services       Precautions / Restrictions Precautions Precautions: Fall Precaution Comments: wound VAC Restrictions RLE Weight Bearing: Non weight bearing    Mobility  Bed Mobility Overal bed mobility: Needs Assistance Bed Mobility: Supine to Sit     Supine to sit: Supervision     General bed mobility comments: for safety; Dependent on momentum to get up  Transfers Overall transfer level: Needs assistance   Transfers: Lateral/Scoot Transfers          Lateral/Scoot Transfers: Min guard General transfer comment: Performed bed to wheelchair transfer towards L; After a round of wheelchair mobitlity, he then transferred wheelchair back to bed towards teh R side; Minguard assist for safety; tends to use momentum   Ambulation/Gait                 Stairs             Wheelchair Mobility    Modified Rankin (Stroke Patients Only)       Balance     Sitting balance-Leahy Scale: Good                                      Cognition Arousal/Alertness: Awake/alert Behavior During Therapy: Restless;Impulsive Overall Cognitive Status: Impaired/Different from baseline Area of Impairment: Safety/judgement                         Safety/Judgement: Decreased awareness of safety;Decreased awareness of deficits            Exercises      General Comments General comments (skin integrity, edema, etc.): Discussed the benefits of intensive rehab to maximize independence and safety with mobility and ADLs, and to give the best foundation in prep for prosthesis; He continues to refuse, choosing to go home      Pertinent Vitals/Pain Pain Assessment: Faces Faces Pain Scale: Hurts little more Pain Location: R residual limb Pain Descriptors / Indicators: Sore Pain Intervention(s): Monitored during  session    Home Living                      Prior Function            PT Goals (current goals can now be found in the care plan section) Acute Rehab PT Goals Patient Stated Goal: "go home" PT Goal Formulation: With patient Time For Goal Achievement: 08/27/19 Potential to Achieve Goals: Fair Progress towards PT goals: Progressing toward goals    Frequency    Min 5X/week      PT Plan Current plan remains appropriate    Co-evaluation              AM-PAC PT "6 Clicks" Mobility   Outcome Measure  Help needed turning from your back to your side while in a flat bed without using  bedrails?: None Help needed moving from lying on your back to sitting on the side of a flat bed without using bedrails?: None Help needed moving to and from a bed to a chair (including a wheelchair)?: A Little Help needed standing up from a chair using your arms (e.g., wheelchair or bedside chair)?: Total Help needed to walk in hospital room?: Total Help needed climbing 3-5 steps with a railing? : Total 6 Click Score: 14    End of Session Equipment Utilized During Treatment: Gait belt Activity Tolerance: Patient tolerated treatment well Patient left: in bed;with call bell/phone within reach Nurse Communication: Mobility status(and recs for dc home) PT Visit Diagnosis: Other abnormalities of gait and mobility (R26.89);Pain Pain - Right/Left: Right Pain - part of body: Leg     Time: 1215-1237 PT Time Calculation (min) (ACUTE ONLY): 22 min  Charges:  $Wheel Chair Management: 8-22 mins                     Roney Marion, [place]  Acute Rehabilitation Services Pager [phone] Office [place] 08/14/2019, 1:00 PM

## 2019-08-14 NOTE — Progress Notes (Signed)
Educated patient on discharge papers and Prevena vac. Questions answered. IV removed.

## 2019-08-14 NOTE — Progress Notes (Signed)
Refused lab work this morning

## 2019-08-14 NOTE — TOC Transition Note (Addendum)
Transition of Care [facility]) - CM/SW Discharge Note   Patient Details  Name: Kurt Butler MRN: [id number] Date of Birth: 12-Jan-1971  Transition of Care [facility]) CM/SW Contact:  Deveron Furlong, RN 08/14/2019, 1:57 PM   Clinical Narrative:    Patient to dc with HH PT, OT, RN (wound care), and HHA.  Patient will be home with family assistance.  Patient has some DME, but needs bariatric DME including bari 3n1w/ drop arm; bari (24 in) WC with amputee leg extension and pad; wide RW, and sliding board.  [employer] is not in house this weekend.  Ordered from main office for delivery to hospital this afternoon.    HH referral accepted by Becky Sax with [facility].     Final next level of care: Home w Home Health Services Barriers to Discharge: No Barriers Identified   Patient Goals and CMS Choice Patient states their goals for this hospitalization and ongoing recovery are:: to get home CMS Medicare.gov Compare Post Acute Care list provided to:: Patient Choice offered to / list presented to : Patient   Discharge Plan and Services                DME Arranged: 3-N-1, Lightweight manual wheelchair with seat cushion, Herbalist) DME Agency: [facility] Date DME Agency Contacted: 08/14/19 Time DME Agency Contacted: 1341 Representative spoke with at DME Agency: Amy HH Arranged: PT, OT, RN, Nurse's Aide HH Agency: [facility] Date [facility] Agency Contacted: 08/14/19 Time HH Agency Contacted: 1357 Representative spoke with at [facility] Agency: Becky Sax

## 2019-08-14 NOTE — Anesthesia Postprocedure Evaluation (Signed)
Anesthesia Post Note  Patient: Kurt Butler  Procedure(s) Performed: RIGHT BELOW KNEE AMPUTATION (Right Knee)     Patient location during evaluation: PACU Anesthesia Type: Regional and General Level of consciousness: awake and alert Pain management: pain level controlled Vital Signs Assessment: post-procedure vital signs reviewed and stable Respiratory status: spontaneous breathing, nonlabored ventilation, respiratory function stable and patient connected to nasal cannula oxygen Cardiovascular status: stable and blood pressure returned to baseline Postop Assessment: no apparent nausea or vomiting Anesthetic complications: no    Last Vitals:  Vitals:   08/13/19 2100 08/14/19 0833  BP: (!) 145/89 (!) 155/96  Pulse: 79 69  Resp: 18 17  Temp: 36.5 C 36.5 C  SpO2: 98%     Last Pain:  Vitals:   08/14/19 0833  TempSrc: Oral  PainSc:                  Chelsey L Woodrum

## 2019-08-15 LAB — SURGICAL PATHOLOGY

## 2019-08-15 LAB — CULTURE, BLOOD (ROUTINE X 2)
Culture: NO GROWTH
Culture: NO GROWTH
Special Requests: ADEQUATE
Special Requests: ADEQUATE

## 2019-08-15 NOTE — Addendum Note (Signed)
Addendum  created 08/15/19 1552 by Elmer Picker, MD   Clinical Note Signed, Intraprocedure Blocks edited

## 2019-08-16 ENCOUNTER — Telehealth: Payer: Self-pay | Admitting: Orthopedic Surgery

## 2019-08-16 NOTE — Telephone Encounter (Signed)
I called and sw HHN to advise that the pt should be wearing a shrinker on the right BKA and she will use a silver alginate on the foot ulcer. He is sch for an appt on 08/22/19 we can  update orders at that time. Pt does not have a shrinker and so she will use 4x4 and ace for compression

## 2019-08-16 NOTE — Telephone Encounter (Signed)
Received call from [facility] with [facility] home health needing wound orders for the patient. The number to contact patient is [phone]

## 2019-08-22 ENCOUNTER — Encounter: Payer: Self-pay | Admitting: Orthopedic Surgery

## 2019-08-22 ENCOUNTER — Other Ambulatory Visit: Payer: Self-pay

## 2019-08-22 ENCOUNTER — Ambulatory Visit (INDEPENDENT_AMBULATORY_CARE_PROVIDER_SITE_OTHER): Payer: Medicare Other | Admitting: Orthopedic Surgery

## 2019-08-22 VITALS — Ht 73.0 in | Wt 360.0 lb

## 2019-08-22 DIAGNOSIS — M86271 Subacute osteomyelitis, right ankle and foot: Secondary | ICD-10-CM

## 2019-08-22 NOTE — Progress Notes (Signed)
Office Visit Note   Patient: Kurt Butler           Date of Birth: 1970-06-23           MRN: [id number] Visit Date: 08/22/2019              Requested by: No referring provider defined for this encounter. PCP: Patient, No Pcp Per  Chief Complaint  Patient presents with  . Right Knee - Routine Post Op    08/12/2019 Right BKA  . Left Foot - Wound Check      HPI: This is a pleasant 49 year old gentleman who is 2 weeks status post right below-knee amputation.  He is also receiving dressing changes for a left plantar heel ulcer.  He is here in follow-up he is going to see a chronic pain specialist tomorrow for pain management  Assessment & Plan: Visit Diagnoses: No diagnosis found.  Plan: I have given him a prescription to get for XL shrinkers from [facility].  He should change these daily.  He can wash his amputation stump with mild soap and water.  With regards to the left he will do dry dressing changes and I have given orders for home health we will also give him a PRAFO boot which she should wear to relieve any pressure on this healing ulcer  Follow-Up Instructions: No follow-ups on file.   Ortho Exam  Patient is alert, oriented, no adenopathy, well-dressed, normal affect, normal respiratory effort. Right lower extremity: Healing below-knee amputation.  Well approximated wound edges with just a minimal amount of bloody drainage wound edges are healthy and about a third of the wound is completely healed.  No fluctuance no foul odor no erythema  Left heel ulcer some granulation and fibrinous tissue no surrounding erythema or fluctuance he does have significant venous stasis in the left leg as well. No foul odor  Imaging: No results found. No images are attached to the encounter.  Labs: Lab Results  Component Value Date   HGBA1C 9.8 (H) 08/11/2019   REPTSTATUS 08/15/2019 FINAL 08/10/2019   REPTSTATUS 08/15/2019 FINAL 08/10/2019   CULT  08/10/2019    NO GROWTH 5  DAYS Performed at [facility], [place] [address], [place], [place] [address]    CULT  08/10/2019    NO GROWTH 5 DAYS Performed at [place] [address] [place], [place] [address]      Lab Results  Component Value Date   ALBUMIN 1.9 (L) 08/11/2019   ALBUMIN 2.0 (L) 08/10/2019    Lab Results  Component Value Date   MG 1.6 (L) 08/12/2019   No results found for: VD25OH  No results found for: PREALBUMIN CBC EXTENDED Latest Ref Rng & Units 08/13/2019 08/12/2019 08/12/2019  WBC 4.0 - 10.5 K/uL 11.7(H) - -  RBC 4.22 - 5.81 MIL/uL 3.81(L) - -  HGB 13.0 - 17.0 g/dL 8.3(L) 9.5(L) 7.9(L)  HCT 39.0 - 52.0 % 27.3(L) 31.8(L) 26.0(L)  PLT 150 - 400 K/uL 610(H) - -  NEUTROABS 1.7 - 7.7 K/uL - - -  LYMPHSABS 0.7 - 4.0 K/uL - - -     Body mass index is 47.5 kg/m.  Orders:  No orders of the defined types were placed in this encounter.  No orders of the defined types were placed in this encounter.    Procedures: No procedures performed  Clinical Data: No additional findings.  ROS:  All other systems negative, except as noted in the HPI. Review of  Systems  Objective: Vital Signs: Ht 6\' 1"  (1.854 m)   Wt (!) 360 lb (163.3 kg)   BMI 47.50 kg/m   Specialty Comments:  No specialty comments available.  PMFS History: Patient Active Problem List   Diagnosis Date Noted  . Heroin abuse (HCC) 08/11/2019  . Type 2 diabetes mellitus with foot ulcer (HCC) 08/11/2019  . Subacute osteomyelitis, right ankle and foot (HCC)   . BMI 45.0-49.9, adult (HCC)   . Severe protein-calorie malnutrition (HCC)   . Chronic venous hypertension (idiopathic) with ulcer and inflammation of bilateral lower extremity (HCC)   . Right foot infection 08/10/2019  . Morbid (severe) obesity due to excess calories (HCC) 04/19/2007  . PERIPHERAL NEUROPATHY 04/19/2007  . HYPERTENSION 04/19/2007  . GERD 04/19/2007  . OSTEOARTHRITIS 04/19/2007  . DYSPNEA 04/19/2007  . TOBACCO ABUSE, HX OF  04/19/2007   Past Medical History:  Diagnosis Date  . Arthritis   . Diabetes mellitus without complication (HCC)   . Drug abuse (HCC)   . Hypertension     No family history on file.  Past Surgical History:  Procedure Laterality Date  . AMPUTATION Right 08/12/2019   Procedure: RIGHT BELOW KNEE AMPUTATION;  Surgeon: 08/14/2019, MD;  Location: [facility] OR;  Service: Orthopedics;  Laterality: Right;   Social History   Occupational History  . Not on file  Tobacco Use  . Smoking status: Never Smoker  . Smokeless tobacco: Never Used  Substance and Sexual Activity  . Alcohol use: Not Currently  . Drug use: Yes    Comment: Sniffs heroin  . Sexual activity: Not Currently

## 2019-08-24 ENCOUNTER — Telehealth: Payer: Self-pay | Admitting: Orthopedic Surgery

## 2019-08-24 MED ORDER — OXYCODONE HCL 15 MG PO TABS: 15.0000 mg | ORAL_TABLET | Freq: Three times a day (TID) | ORAL | 0 refills | Status: DC | PRN

## 2019-08-24 NOTE — Telephone Encounter (Signed)
Patient called and stated that he was going to pain management but it will be about 3 weeks before they would call him with an appt.  Patient is requesting medication for pain.  Please call patient to advise.  [phone]

## 2019-08-24 NOTE — Telephone Encounter (Signed)
Pt is s/p a right BKA 08/12/19 please see below and advise.

## 2019-08-24 NOTE — Telephone Encounter (Signed)
sent 

## 2019-08-24 NOTE — Addendum Note (Signed)
Addended by: Barnie Del R on: 08/24/2019 03:22 PM   Modules accepted: Orders

## 2019-08-26 ENCOUNTER — Telehealth: Payer: Self-pay | Admitting: Orthopedic Surgery

## 2019-08-26 NOTE — Telephone Encounter (Signed)
Received call from Nenita-(PT) with [facility] advised patient declined (PT) at this time. The number to contact Nenita is [phone]

## 2019-08-26 NOTE — Telephone Encounter (Signed)
Noted  

## 2019-08-29 ENCOUNTER — Ambulatory Visit: Payer: Medicare Other | Admitting: Orthopedic Surgery

## 2019-08-31 ENCOUNTER — Ambulatory Visit: Payer: Medicare Other | Admitting: Family

## 2019-09-05 ENCOUNTER — Encounter: Payer: Self-pay | Admitting: Orthopedic Surgery

## 2019-09-05 ENCOUNTER — Ambulatory Visit (INDEPENDENT_AMBULATORY_CARE_PROVIDER_SITE_OTHER): Payer: Medicare Other | Admitting: Orthopedic Surgery

## 2019-09-05 ENCOUNTER — Other Ambulatory Visit: Payer: Self-pay

## 2019-09-05 VITALS — Ht 73.0 in | Wt 360.0 lb

## 2019-09-05 DIAGNOSIS — L97521 Non-pressure chronic ulcer of other part of left foot limited to breakdown of skin: Secondary | ICD-10-CM

## 2019-09-05 DIAGNOSIS — Z89511 Acquired absence of right leg below knee: Secondary | ICD-10-CM

## 2019-09-05 MED ORDER — OXYCODONE HCL 15 MG PO TABS: 15.0000 mg | ORAL_TABLET | Freq: Three times a day (TID) | ORAL | 0 refills | Status: AC | PRN

## 2019-09-05 NOTE — Progress Notes (Signed)
Office Visit Note   Patient: Kurt Butler           Date of Birth: 05-May-1971           MRN: [id number] Visit Date: 09/05/2019              Requested by: No referring provider defined for this encounter. PCP: Patient, No Pcp Per  Chief Complaint  Patient presents with  . Right Leg - Routine Post Op    08/12/19 right BKA   . Left Foot - Wound Check      HPI: Patient is a 49 year old gentleman who presents status post right transtibial amputation almost 4 weeks out from surgery.  Patient states that recently he fell off his wheelchair onto the residual limb.  Patient also has a decubitus ulcer on the left heel.  Patient is smoking and drinking [facility].  Patient stated that he used Clorox wipes to clean his left foot.  Assessment & Plan: Visit Diagnoses:  1. Right below-knee amputee (HCC)   2. Non-pressure chronic ulcer of other part of left foot limited to breakdown of skin (HCC)     Plan: Staples harvested today patient is given a prescription for [employer] for a prosthesis on the right K3 level secondary to patient's increased body mass index and will need the higher level prosthesis to sustain his body weight.  Recommended against using Clorox on the left foot use soap and water dry dressing change and continue with the PRAFO on the left.  Follow-Up Instructions: Return in about 2 weeks (around 09/19/2019).   Ortho Exam  Patient is alert, oriented, no adenopathy, well-dressed, normal affect, normal respiratory effort. Examination there is skin breakdown on the left foot from a Clorox.  The heel ulcer is superficial and 5 cm in diameter there is blistering and abrasions to the second toes left foot but there is no sausage digit swelling no signs of infection.  Patient does have swelling medially on the right transtibial amputation but the wound edges are well approximated with no cellulitis no signs of infection.  Patient is a new right transtibial  amputee.  Patient's  current comorbidities are not expected to impact the ability to function with the prescribed prosthesis. Patient verbally communicates a strong desire to use a prosthesis. Patient currently requires mobility aids to ambulate without a prosthesis.  Expects not to use mobility aids with a new prosthesis.  Patient is a K3 level ambulator that spends a lot of time walking around on uneven terrain over obstacles, up and down stairs, and ambulates with a variable cadence.     Imaging: No results found. No images are attached to the encounter.  Labs: Lab Results  Component Value Date   HGBA1C 9.8 (H) 08/11/2019   REPTSTATUS 08/15/2019 FINAL 08/10/2019   REPTSTATUS 08/15/2019 FINAL 08/10/2019   CULT  08/10/2019    NO GROWTH 5 DAYS Performed at [facility], [address] [place], [place] [address]    CULT  08/10/2019    NO GROWTH 5 DAYS Performed at [facility], [address] [place], [place] [address]      Lab Results  Component Value Date   ALBUMIN 1.9 (L) 08/11/2019   ALBUMIN 2.0 (L) 08/10/2019    Lab Results  Component Value Date   MG 1.6 (L) 08/12/2019   No results found for: VD25OH  No results found for: PREALBUMIN CBC EXTENDED Latest Ref Rng & Units 08/13/2019 08/12/2019 08/12/2019  WBC  4.0 - 10.5 K/uL 11.7(H) - -  RBC 4.22 - 5.81 MIL/uL 3.81(L) - -  HGB 13.0 - 17.0 g/dL 8.3(L) 9.5(L) 7.9(L)  HCT 39.0 - 52.0 % 27.3(L) 31.8(L) 26.0(L)  PLT 150 - 400 K/uL 610(H) - -  NEUTROABS 1.7 - 7.7 K/uL - - -  LYMPHSABS 0.7 - 4.0 K/uL - - -     Body mass index is 47.5 kg/m.  Orders:  No orders of the defined types were placed in this encounter.  No orders of the defined types were placed in this encounter.    Procedures: No procedures performed  Clinical Data: No additional findings.  ROS:  All other systems negative, except as noted in the HPI. Review of Systems  Objective: Vital Signs: Ht 6\' 1"  (1.854 m)   Wt (!) 360 lb (163.3 kg)   BMI  47.50 kg/m   Specialty Comments:  No specialty comments available.  PMFS History: Patient Active Problem List   Diagnosis Date Noted  . Heroin abuse ([place]) 08/11/2019  . Type 2 diabetes mellitus with foot ulcer ([place]) 08/11/2019  . Subacute osteomyelitis, right ankle and foot ([place])   . BMI 45.0-49.9, adult ([place])   . Severe protein-calorie malnutrition ([place])   . Chronic venous hypertension (idiopathic) with ulcer and inflammation of bilateral lower extremity (HCC)   . Right foot infection 08/10/2019  . Morbid (severe) obesity due to excess calories ([place]) 04/19/2007  . PERIPHERAL NEUROPATHY 04/19/2007  . HYPERTENSION 04/19/2007  . GERD 04/19/2007  . OSTEOARTHRITIS 04/19/2007  . DYSPNEA 04/19/2007  . TOBACCO ABUSE, HX OF 04/19/2007   Past Medical History:  Diagnosis Date  . Arthritis   . Diabetes mellitus without complication ([place])   . Drug abuse ([place])   . Hypertension     History reviewed. No pertinent family history.  Past Surgical History:  Procedure Laterality Date  . AMPUTATION Right 08/12/2019   Procedure: RIGHT BELOW KNEE AMPUTATION;  Surgeon: Kurt Minion, MD;  Location: [place];  Service: Orthopedics;  Laterality: Right;   Social History   Occupational History  . Not on file  Tobacco Use  . Smoking status: Never Smoker  . Smokeless tobacco: Never Used  Substance and Sexual Activity  . Alcohol use: Not Currently  . Drug use: Yes    Comment: Sniffs heroin  . Sexual activity: Not Currently

## 2019-09-05 NOTE — Addendum Note (Signed)
Addended by: Aldean Baker on: 09/05/2019 03:28 PM   Modules accepted: Orders

## 2019-09-19 ENCOUNTER — Ambulatory Visit: Payer: Medicare Other | Admitting: Orthopedic Surgery

## 2019-09-26 ENCOUNTER — Other Ambulatory Visit: Payer: Self-pay

## 2019-09-26 ENCOUNTER — Ambulatory Visit (INDEPENDENT_AMBULATORY_CARE_PROVIDER_SITE_OTHER): Payer: Medicare Other | Admitting: Orthopedic Surgery

## 2019-09-26 ENCOUNTER — Encounter: Payer: Self-pay | Admitting: Orthopedic Surgery

## 2019-09-26 VITALS — Ht 73.0 in | Wt 360.0 lb

## 2019-09-26 DIAGNOSIS — Z89511 Acquired absence of right leg below knee: Secondary | ICD-10-CM

## 2019-09-30 ENCOUNTER — Encounter: Payer: Self-pay | Admitting: Orthopedic Surgery

## 2019-09-30 NOTE — Progress Notes (Signed)
Office Visit Note   Patient: Kurt Butler           Date of Birth: [birth year]           MRN: [id number] Visit Date: 09/26/2019              Requested by: No referring provider defined for this encounter. PCP: Patient, No Pcp Per  Chief Complaint  Patient presents with  . Right Leg - Routine Post Op    08/12/19  Right BKA  . Left Foot - Wound Check      HPI: Patient is a 49 year old gentleman who presents about 7 weeks status post right transtibial amputation.  Patient is currently using a shrinker and states there is still little bit of drainage from a few open areas.  Assessment & Plan: Visit Diagnoses:  1. Right below-knee amputee [facility])     Plan: We will obtain a PRAFO for the left lower extremity to protect the left heel ulcer continue with the stump shrinker on the right reevaluate in 3 weeks.  Follow-Up Instructions: Return in about 3 weeks (around 10/17/2019).   Ortho Exam  Patient is alert, oriented, no adenopathy, well-dressed, normal affect, normal respiratory effort. Examination patient has a healed left heel ulcer.  We will get him a PRAFO to protect this.  Examination the right transtibial amputation there is significant improvement with no cellulitis no odor no drainage no signs of infection.  There is some eschar over the incision.  Imaging: No results found. No images are attached to the encounter.  Labs: Lab Results  Component Value Date   HGBA1C 9.8 (H) 08/11/2019   REPTSTATUS 08/15/2019 FINAL 08/10/2019   REPTSTATUS 08/15/2019 FINAL 08/10/2019   CULT  08/10/2019    NO GROWTH 5 DAYS Performed at [facility], [place] [address], [place], [place] [address]    CULT  08/10/2019    NO GROWTH 5 DAYS Performed at [facility] [address] [place], [place] [address]      Lab Results  Component Value Date   ALBUMIN 1.9 (L) 08/11/2019   ALBUMIN 2.0 (L) 08/10/2019    Lab Results  Component Value Date   MG 1.6 (L) 08/12/2019   No  results found for: VD25OH  No results found for: PREALBUMIN CBC EXTENDED Latest Ref Rng & Units 08/13/2019 08/12/2019 08/12/2019  WBC 4.0 - 10.5 K/uL 11.7(H) - -  RBC 4.22 - 5.81 MIL/uL 3.81(L) - -  HGB 13.0 - 17.0 g/dL 8.3(L) 9.5(L) 7.9(L)  HCT 39.0 - 52.0 % 27.3(L) 31.8(L) 26.0(L)  PLT 150 - 400 K/uL 610(H) - -  NEUTROABS 1.7 - 7.7 K/uL - - -  LYMPHSABS 0.7 - 4.0 K/uL - - -     Body mass index is 47.5 kg/m.  Orders:  No orders of the defined types were placed in this encounter.  No orders of the defined types were placed in this encounter.    Procedures: No procedures performed  Clinical Data: No additional findings.  ROS:  All other systems negative, except as noted in the HPI. Review of Systems  Objective: Vital Signs: Ht 6\' 1"  (1.854 m)   Wt (!) 360 lb (163.3 kg)   BMI 47.50 kg/m   Specialty Comments:  No specialty comments available.  PMFS History: Patient Active Problem List   Diagnosis Date Noted  . Heroin abuse ([place]) 08/11/2019  . Type 2 diabetes mellitus with foot ulcer ([place]) 08/11/2019  . Subacute osteomyelitis, right ankle  and foot (HCC)   . BMI 45.0-49.9, adult (HCC)   . Severe protein-calorie malnutrition (HCC)   . Chronic venous hypertension (idiopathic) with ulcer and inflammation of bilateral lower extremity (HCC)   . Right foot infection 08/10/2019  . Morbid (severe) obesity due to excess calories (HCC) 04/19/2007  . PERIPHERAL NEUROPATHY 04/19/2007  . HYPERTENSION 04/19/2007  . GERD 04/19/2007  . OSTEOARTHRITIS 04/19/2007  . DYSPNEA 04/19/2007  . TOBACCO ABUSE, HX OF 04/19/2007   Past Medical History:  Diagnosis Date  . Arthritis   . Diabetes mellitus without complication (HCC)   . Drug abuse (HCC)   . Hypertension     History reviewed. No pertinent family history.  Past Surgical History:  Procedure Laterality Date  . AMPUTATION Right 08/12/2019   Procedure: RIGHT BELOW KNEE AMPUTATION;  Surgeon: Nadara Mustard, MD;  Location: [facility]  OR;  Service: Orthopedics;  Laterality: Right;   Social History   Occupational History  . Not on file  Tobacco Use  . Smoking status: Never Smoker  . Smokeless tobacco: Never Used  Substance and Sexual Activity  . Alcohol use: Not Currently  . Drug use: Yes    Comment: Sniffs heroin  . Sexual activity: Not Currently

## 2019-10-17 ENCOUNTER — Ambulatory Visit: Payer: Medicare Other | Admitting: Orthopedic Surgery

## 2021-01-14 DEATH — deceased

## 2021-10-13 IMAGING — DX DG ANKLE 2V *R*
2 series · 2 of 2 positions shown · non-contrast
Comparison: Report 01/18/2013

CLINICAL DATA: Wound infection on bottom of foot

EXAM:
RIGHT FOOT - 2 VIEW; RIGHT ANKLE - 2 VIEW

[ankle ap]
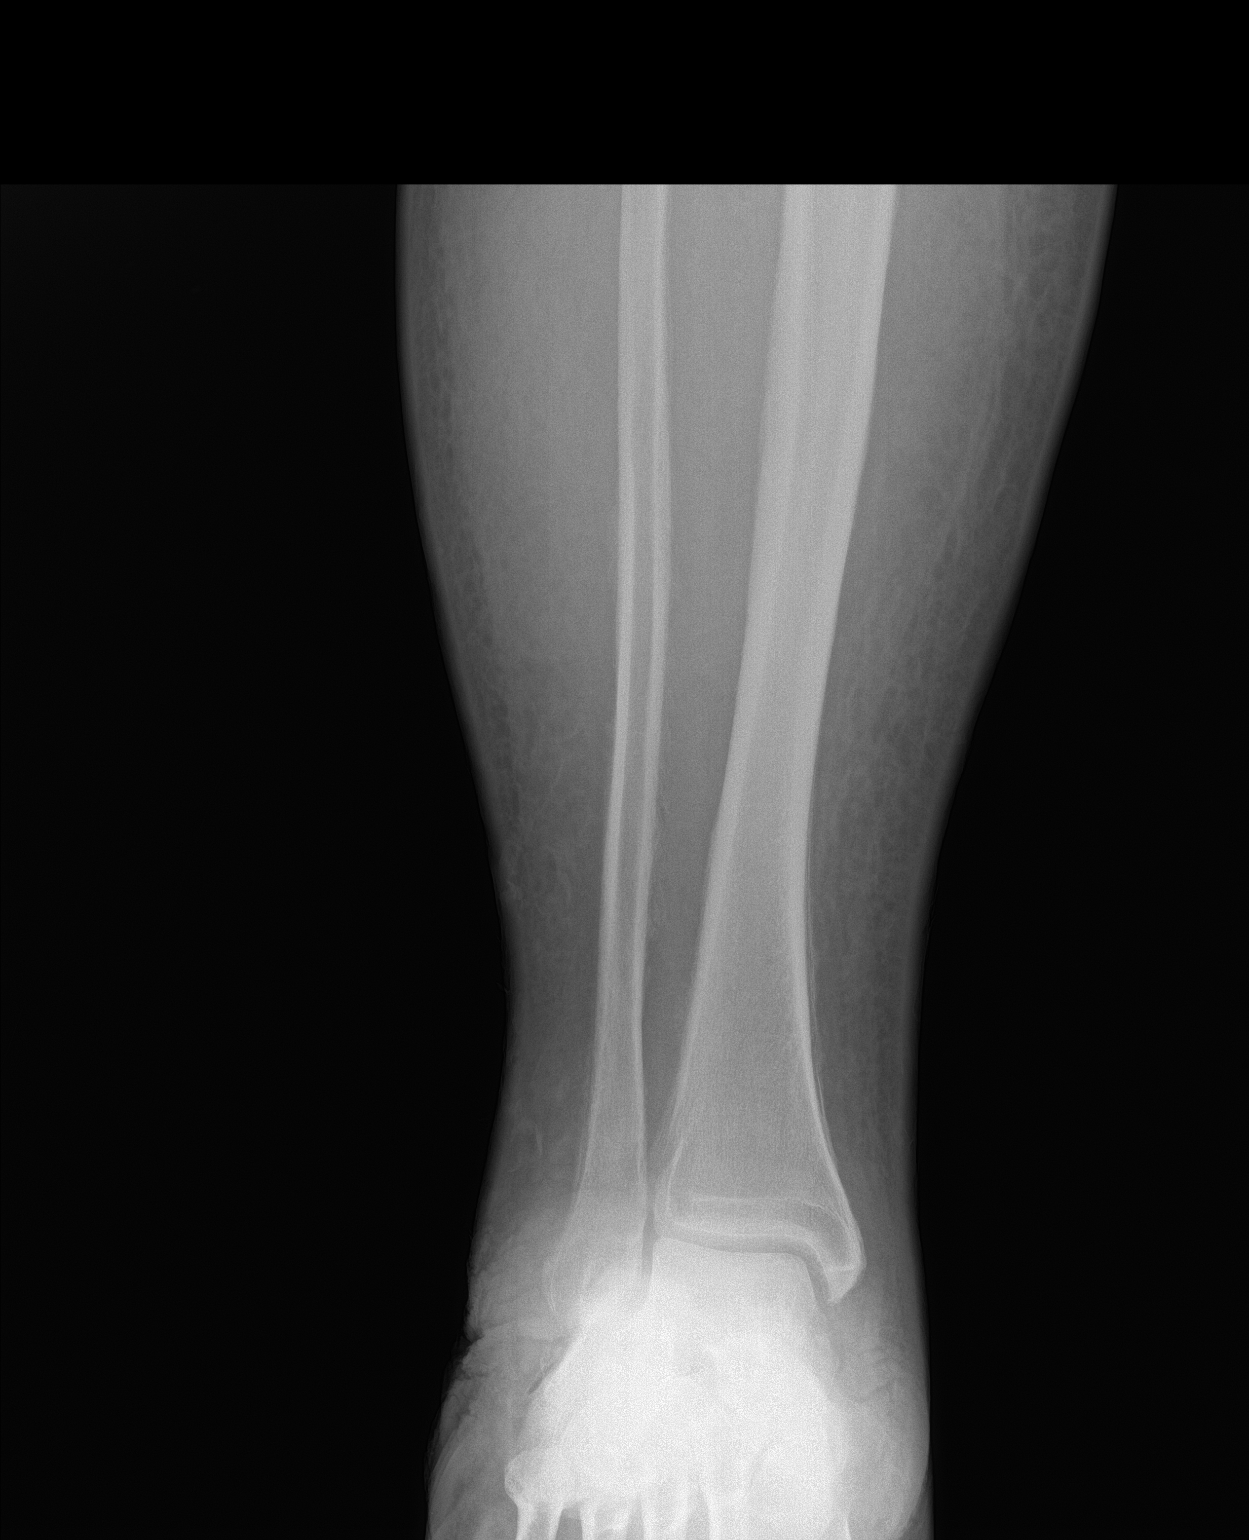

[ankle lat]
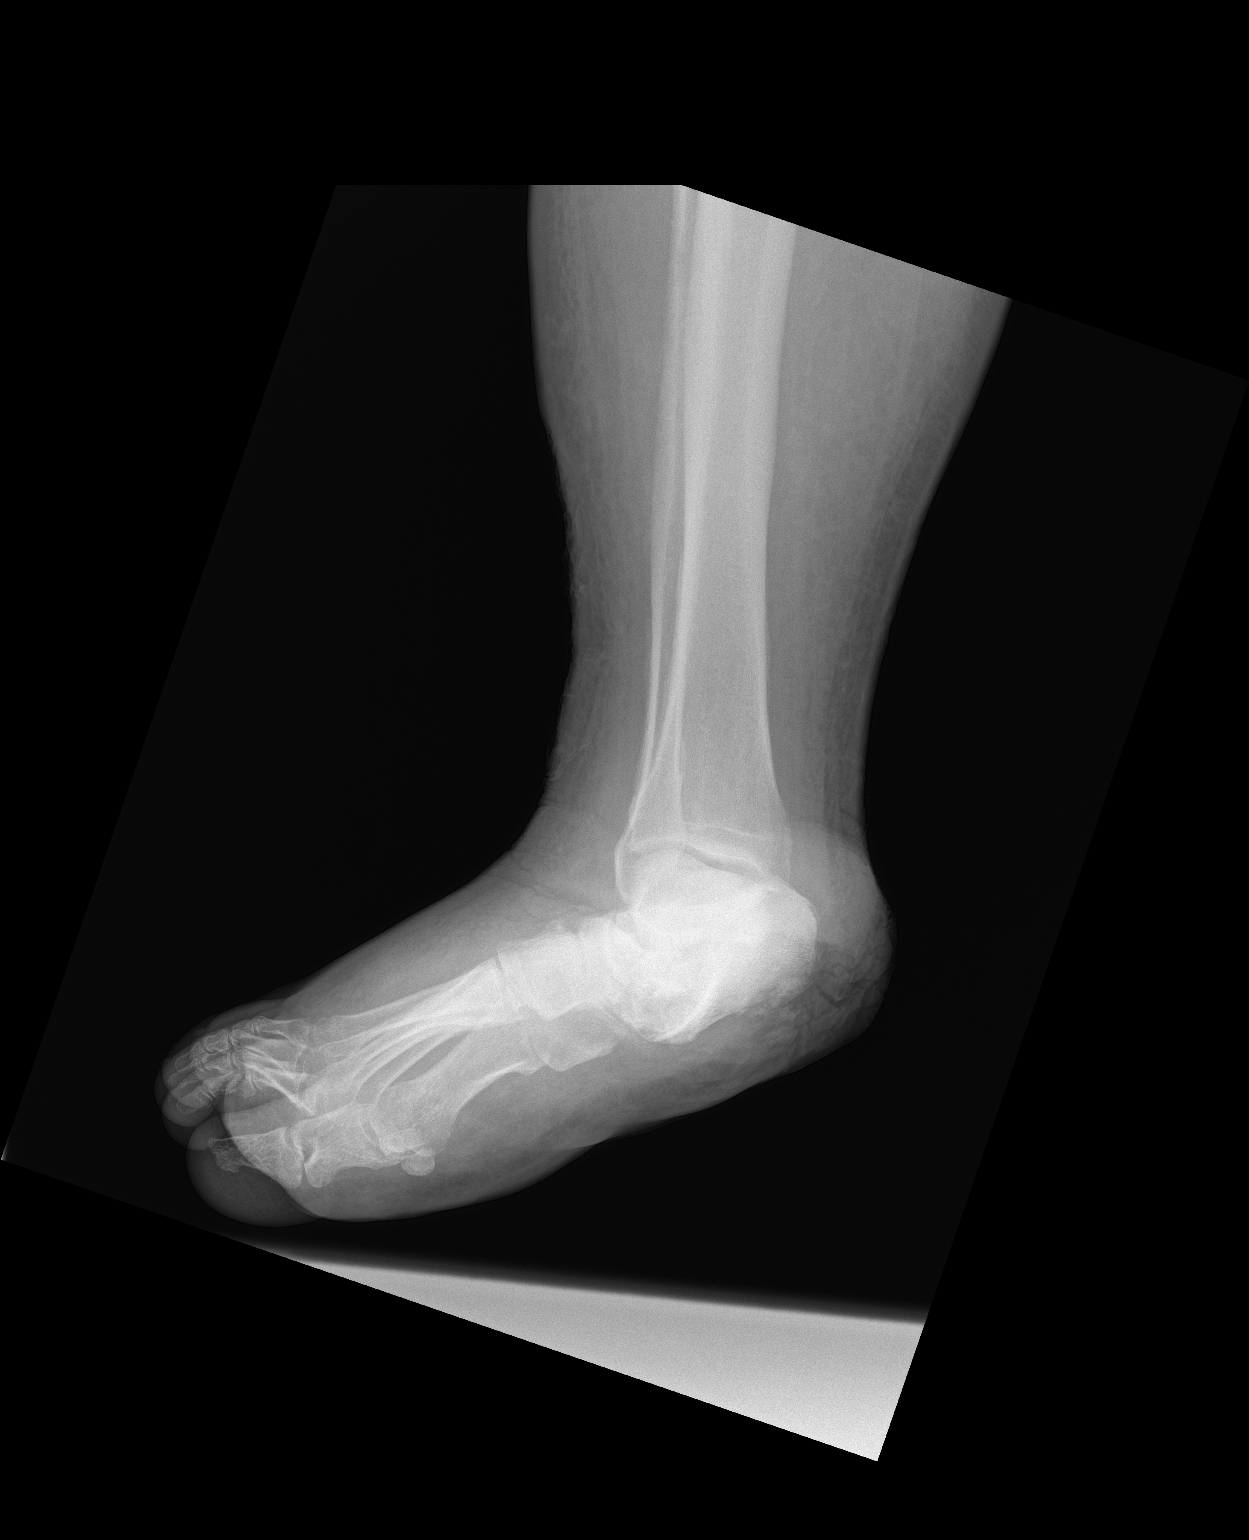

[2 of 2 positions shown; findings below may reference images not displayed]

FINDINGS: Right foot: Diffuse soft tissue edema. No fracture or malalignment
within the digits of the right foot. Loss of the heel pad soft
tissues presumably due to large ulcer. Lucency and probable mild
fragmentation at the plantar surface of the calcaneus. Distorted
appearance of the talocalcaneal interval. Potential widened joint
space between the navicular and first cuneiform dorsally.

Right ankle: Mild periosteal reaction involving the tibial and
fibular shaft distally. Diffuse soft tissue edema. Ankle mortise
grossly symmetric. Abnormal appearance of calcaneus. Distorted
anatomy at the talocalcaneal interval.
IMPRESSION: 1. Prominent loss of heel pad soft tissues with lucency, presumably
due to large ulceration. There is lucency and cortical fragmentation
of the plantar aspect of the calcaneus bone suspicious for
osteomyelitis.
2. Poorly defined subtalar joints with distorted appearance of
hindfoot anatomy. MRI would better demonstrate extent of suspected
osteomyelitic changes.
